# Patient Record
Sex: Male | Born: 1970 | Hispanic: No | Marital: Married | Smoking: Never smoker
Health system: Southern US, Community
[De-identification: ages and names within clinical notes are randomized; demographics above are authoritative.]

## PROBLEM LIST (undated history)

## (undated) DIAGNOSIS — E119 Type 2 diabetes mellitus without complications: Secondary | ICD-10-CM

## (undated) DIAGNOSIS — G4730 Sleep apnea, unspecified: Secondary | ICD-10-CM

## (undated) HISTORY — PX: WISDOM TOOTH EXTRACTION: SHX21

---

## 2015-11-07 ENCOUNTER — Emergency Department (HOSPITAL_COMMUNITY): Payer: Managed Care, Other (non HMO)

## 2015-11-07 ENCOUNTER — Encounter (HOSPITAL_COMMUNITY): Payer: Self-pay | Admitting: Emergency Medicine

## 2015-11-07 ENCOUNTER — Inpatient Hospital Stay (HOSPITAL_COMMUNITY)
Admission: EM | Admit: 2015-11-07 | Discharge: 2015-11-11 | DRG: 872 | Disposition: A | Discharge status: Home / Self Care | Payer: Managed Care, Other (non HMO) | Attending: Internal Medicine | Admitting: Internal Medicine

## 2015-11-07 ENCOUNTER — Inpatient Hospital Stay (HOSPITAL_COMMUNITY): Payer: Managed Care, Other (non HMO)

## 2015-11-07 DIAGNOSIS — E118 Type 2 diabetes mellitus with unspecified complications: Secondary | ICD-10-CM

## 2015-11-07 DIAGNOSIS — B9689 Other specified bacterial agents as the cause of diseases classified elsewhere: Secondary | ICD-10-CM

## 2015-11-07 DIAGNOSIS — L0390 Cellulitis, unspecified: Secondary | ICD-10-CM | POA: Insufficient documentation

## 2015-11-07 DIAGNOSIS — N289 Disorder of kidney and ureter, unspecified: Secondary | ICD-10-CM | POA: Diagnosis not present

## 2015-11-07 DIAGNOSIS — L03115 Cellulitis of right lower limb: Secondary | ICD-10-CM | POA: Insufficient documentation

## 2015-11-07 DIAGNOSIS — M7989 Other specified soft tissue disorders: Secondary | ICD-10-CM | POA: Diagnosis not present

## 2015-11-07 DIAGNOSIS — E1129 Type 2 diabetes mellitus with other diabetic kidney complication: Secondary | ICD-10-CM | POA: Diagnosis not present

## 2015-11-07 DIAGNOSIS — E119 Type 2 diabetes mellitus without complications: Secondary | ICD-10-CM | POA: Diagnosis present

## 2015-11-07 DIAGNOSIS — N179 Acute kidney failure, unspecified: Secondary | ICD-10-CM | POA: Diagnosis present

## 2015-11-07 DIAGNOSIS — Z7984 Long term (current) use of oral hypoglycemic drugs: Secondary | ICD-10-CM

## 2015-11-07 DIAGNOSIS — A419 Sepsis, unspecified organism: Principal | ICD-10-CM | POA: Diagnosis present

## 2015-11-07 DIAGNOSIS — M79609 Pain in unspecified limb: Secondary | ICD-10-CM | POA: Diagnosis not present

## 2015-11-07 DIAGNOSIS — D72829 Elevated white blood cell count, unspecified: Secondary | ICD-10-CM | POA: Insufficient documentation

## 2015-11-07 DIAGNOSIS — L0291 Cutaneous abscess, unspecified: Secondary | ICD-10-CM

## 2015-11-07 DIAGNOSIS — L02419 Cutaneous abscess of limb, unspecified: Secondary | ICD-10-CM | POA: Diagnosis present

## 2015-11-07 DIAGNOSIS — L03119 Cellulitis of unspecified part of limb: Secondary | ICD-10-CM | POA: Diagnosis present

## 2015-11-07 DIAGNOSIS — M79661 Pain in right lower leg: Secondary | ICD-10-CM | POA: Diagnosis present

## 2015-11-07 HISTORY — DX: Type 2 diabetes mellitus without complications: E11.9

## 2015-11-07 LAB — CBC WITH DIFFERENTIAL/PLATELET
Basophils Absolute: 0 10*3/uL (ref 0.0–0.1)
Basophils Relative: 0 %
EOS ABS: 0 10*3/uL (ref 0.0–0.7)
Eosinophils Relative: 0 %
HCT: 47.3 % (ref 39.0–52.0)
HEMOGLOBIN: 16.7 g/dL (ref 13.0–17.0)
LYMPHS ABS: 2.1 10*3/uL (ref 0.7–4.0)
Lymphocytes Relative: 15 %
MCH: 27.8 pg (ref 26.0–34.0)
MCHC: 35.3 g/dL (ref 30.0–36.0)
MCV: 78.8 fL (ref 78.0–100.0)
MONOS PCT: 12 %
Monocytes Absolute: 1.7 10*3/uL — ABNORMAL HIGH (ref 0.1–1.0)
NEUTROS PCT: 73 %
Neutro Abs: 9.8 10*3/uL — ABNORMAL HIGH (ref 1.7–7.7)
Platelets: 257 10*3/uL (ref 150–400)
RBC: 6 MIL/uL — ABNORMAL HIGH (ref 4.22–5.81)
RDW: 13.4 % (ref 11.5–15.5)
WBC: 13.6 10*3/uL — ABNORMAL HIGH (ref 4.0–10.5)

## 2015-11-07 LAB — COMPREHENSIVE METABOLIC PANEL
ALBUMIN: 3.9 g/dL (ref 3.5–5.0)
ALT: 32 U/L (ref 17–63)
AST: 31 U/L (ref 15–41)
Alkaline Phosphatase: 71 U/L (ref 38–126)
Anion gap: 15 (ref 5–15)
BUN: 21 mg/dL — AB (ref 6–20)
CHLORIDE: 98 mmol/L — AB (ref 101–111)
CO2: 22 mmol/L (ref 22–32)
CREATININE: 1.63 mg/dL — AB (ref 0.61–1.24)
Calcium: 9.3 mg/dL (ref 8.9–10.3)
GFR calc Af Amer: 59 mL/min — ABNORMAL LOW (ref >60)
GFR calc non Af Amer: 50 mL/min — ABNORMAL LOW (ref >60)
Glucose, Bld: 184 mg/dL — ABNORMAL HIGH (ref 65–99)
Potassium: 4 mmol/L (ref 3.5–5.1)
SODIUM: 135 mmol/L (ref 135–145)
Total Bilirubin: 1 mg/dL (ref 0.3–1.2)
Total Protein: 8.5 g/dL — ABNORMAL HIGH (ref 6.5–8.1)

## 2015-11-07 LAB — I-STAT CG4 LACTIC ACID, ED: LACTIC ACID, VENOUS: 1.94 mmol/L (ref 0.5–2.0)

## 2015-11-07 MED ORDER — SODIUM CHLORIDE 0.9 % IV BOLUS (SEPSIS)
1000.0000 mL | Freq: Once | INTRAVENOUS | Status: AC
  Administered 2015-11-07: 1000 mL via INTRAVENOUS

## 2015-11-07 MED ORDER — MORPHINE SULFATE (PF) 4 MG/ML IV SOLN
4.0000 mg | Freq: Once | INTRAVENOUS | Status: AC
  Administered 2015-11-07: 4 mg via INTRAVENOUS
  Filled 2015-11-07: qty 1

## 2015-11-07 MED ORDER — ACETAMINOPHEN 325 MG PO TABS
650.0000 mg | ORAL_TABLET | Freq: Four times a day (QID) | ORAL | Status: DC | PRN
  Administered 2015-11-07 – 2015-11-08 (×3): 650 mg via ORAL
  Filled 2015-11-07 (×3): qty 2

## 2015-11-07 MED ORDER — VANCOMYCIN HCL IN DEXTROSE 1-5 GM/200ML-% IV SOLN
1000.0000 mg | Freq: Once | INTRAVENOUS | Status: AC
  Administered 2015-11-07: 1000 mg via INTRAVENOUS
  Filled 2015-11-07: qty 200

## 2015-11-07 MED ORDER — HYDROCODONE-ACETAMINOPHEN 5-325 MG PO TABS
1.0000 | ORAL_TABLET | ORAL | Status: DC | PRN
  Administered 2015-11-08: 2 via ORAL
  Administered 2015-11-09: 1 via ORAL
  Filled 2015-11-07: qty 1
  Filled 2015-11-07: qty 2

## 2015-11-07 MED ORDER — ENOXAPARIN SODIUM 40 MG/0.4ML ~~LOC~~ SOLN
40.0000 mg | SUBCUTANEOUS | Status: DC
  Administered 2015-11-09: 40 mg via SUBCUTANEOUS
  Filled 2015-11-07 (×4): qty 0.4

## 2015-11-07 MED ORDER — POLYETHYLENE GLYCOL 3350 17 G PO PACK: 17.0000 g | PACK | Freq: Every day | ORAL | Status: DC | PRN

## 2015-11-07 MED ORDER — DOCUSATE SODIUM 100 MG PO CAPS
100.0000 mg | ORAL_CAPSULE | Freq: Two times a day (BID) | ORAL | Status: DC
  Administered 2015-11-10 – 2015-11-11 (×2): 100 mg via ORAL
  Filled 2015-11-07 (×9): qty 1

## 2015-11-07 MED ORDER — SODIUM CHLORIDE 0.9 % IV SOLN: INTRAVENOUS | Status: AC

## 2015-11-07 MED ORDER — VANCOMYCIN HCL 10 G IV SOLR: 1250.0000 mg | INTRAVENOUS | Status: DC

## 2015-11-07 MED ORDER — MORPHINE SULFATE (PF) 2 MG/ML IV SOLN
1.0000 mg | INTRAVENOUS | Status: DC | PRN
  Administered 2015-11-08 – 2015-11-10 (×4): 1 mg via INTRAVENOUS
  Filled 2015-11-07 (×4): qty 1

## 2015-11-07 MED ORDER — INSULIN ASPART 100 UNIT/ML ~~LOC~~ SOLN
0.0000 [IU] | Freq: Three times a day (TID) | SUBCUTANEOUS | Status: DC
  Administered 2015-11-09: 2 [IU] via SUBCUTANEOUS

## 2015-11-07 MED ORDER — INSULIN ASPART 100 UNIT/ML ~~LOC~~ SOLN: 0.0000 [IU] | Freq: Every day | SUBCUTANEOUS | Status: DC

## 2015-11-07 MED ORDER — ONDANSETRON HCL 4 MG PO TABS: 4.0000 mg | ORAL_TABLET | Freq: Three times a day (TID) | ORAL | Status: DC | PRN

## 2015-11-07 MED ORDER — SODIUM CHLORIDE 0.9 % IV SOLN
Freq: Once | INTRAVENOUS | Status: AC
  Administered 2015-11-07: 17:00:00 via INTRAVENOUS

## 2015-11-07 MED ORDER — PIPERACILLIN-TAZOBACTAM 3.375 G IVPB
3.3750 g | Freq: Once | INTRAVENOUS | Status: AC
  Administered 2015-11-07: 3.375 g via INTRAVENOUS
  Filled 2015-11-07: qty 50

## 2015-11-07 MED ORDER — VANCOMYCIN HCL IN DEXTROSE 1-5 GM/200ML-% IV SOLN
1000.0000 mg | INTRAVENOUS | Status: AC
  Administered 2015-11-07: 1000 mg via INTRAVENOUS
  Filled 2015-11-07: qty 200

## 2015-11-07 NOTE — Progress Notes (Signed)
Pharmacy Antibiotic Note  Ralph Bradley is a 45 y.o. male admitted on 11/07/2015 with cellulitis.  Pharmacy has been consulted for Vancomycin & Zosyn dosing.  Plan: Zosyn 3.375gm IV Q8h to be infused over 4hrs Vancomycin 2gm IV loading dose x1 then  IV q24h Check Vancomycin trough at steady state Monitor renal function and cx data      Temp (24hrs), Avg:98.5 F (36.9 C), Min:98.5 F (36.9 C), Max:98.5 F (36.9 C)   Recent Labs Lab 11/07/15 1655 11/07/15 1657  WBC  --  13.6*  CREATININE  --  1.63*  LATICACIDVEN 1.94  --     CrCl cannot be calculated (Unknown ideal weight.).    No Known Allergies  Antimicrobials this admission: 2/2 Vanc >> 2/2 Zosyn >>   Dose adjustments this admission:  Microbiology results: 2/2 BCx: IP  Thank you for allowing pharmacy to be a part of this patient's care.  Elson Clan 11/07/2015 7:19 PM

## 2015-11-07 NOTE — ED Provider Notes (Signed)
CSN: [id number]     Arrival date & time 11/07/15  1611 History   First MD Initiated Contact with Patient 11/07/15 1625     Chief Complaint  Patient presents with  . Cellulitis     (Consider location/radiation/quality/duration/timing/severity/associated sxs/prior Treatment) HPI Comments: Diabetic sent from PCPs office with cellulitis of the right leg. States it began approximately 3 days ago and has progressively worsened. Denies any injuries. Has had chills but not checked his temperature. No nausea or vomiting. No chest pain or shortness of breath. Progressive redness and swelling to R leg, circumferential. Some streaking to R medial thigh today.  Saw PCP and sent to the ED.  Has not been on antibiotics yet.  The history is provided by the patient and a relative.    Past Medical History  Diagnosis Date  . Diabetes mellitus without complication [facility])    Past Surgical History  Procedure Laterality Date  . Wisdom tooth extraction      20 years ago   Family History  Problem Relation Age of Onset  . Diabetes Mellitus II Mother    Social History  Substance Use Topics  . Smoking status: Never Smoker   . Smokeless tobacco: Never Used  . Alcohol Use: No    Review of Systems  Constitutional: Negative for fever, activity change and appetite change.  HENT: Negative for congestion and rhinorrhea.   Respiratory: Negative for cough, chest tightness and shortness of breath.   Cardiovascular: Positive for leg swelling. Negative for chest pain.  Gastrointestinal: Negative for nausea, vomiting and abdominal pain.  Genitourinary: Negative for dysuria and testicular pain.  Musculoskeletal: Negative for myalgias and arthralgias.  Skin: Positive for wound.  Neurological: Negative for dizziness and headaches.  A complete 10 system review of systems was obtained and all systems are negative except as noted in the HPI and PMH.      Allergies  Review of patient's allergies indicates no known  allergies.  Home Medications   Prior to Admission medications   Medication Sig Start Date End Date Taking? Authorizing Provider  metFORMIN (GLUCOPHAGE) 1000 MG tablet Take 1,000 mg by mouth 2 (two) times daily. 08/26/15  Yes Historical Provider, MD   BP 121/85 mmHg  Pulse 102  Temp(Src) 102.2 F (39 C) (Oral)  Resp 18  Ht  (1.727 m)  Wt 229 lb 8 oz (104.1 kg)  BMI 34.90 kg/m2  SpO2 98% Physical Exam  Constitutional: He is oriented to person, place, and time. He appears well-developed and well-nourished. No distress.  HENT:  Head: Normocephalic and atraumatic.  Mouth/Throat: Oropharynx is clear and moist. No oropharyngeal exudate.  Eyes: Conjunctivae and EOM are normal. Pupils are equal, round, and reactive to light.  Neck: Normal range of motion. Neck supple.  No meningismus.  Cardiovascular: Normal rate, normal heart sounds and intact distal pulses.   No murmur heard. tachycardic  Pulmonary/Chest: Effort normal and breath sounds normal. No respiratory distress.  Abdominal: Soft. There is no tenderness. There is no rebound and no guarding.  Musculoskeletal: Normal range of motion. He exhibits edema and tenderness.  Circumferential erythema, warmth, swelling to RLE.  Abrasion to dorsal foot. Intact DP pulse. Streaking erythema to R medial thigh Compartments soft  Neurological: He is alert and oriented to person, place, and time. No cranial nerve deficit. He exhibits normal muscle tone. Coordination normal.  No ataxia on finger to nose bilaterally. No pronator drift. 5/5 strength throughout. CN 2-12 intact.Equal grip strength. Sensation intact.   Skin:  Skin is warm.  Psychiatric: He has a normal mood and affect. His behavior is normal.  Nursing note and vitals reviewed.       ED Course  Procedures (including critical care time) Labs Review Labs Reviewed  COMPREHENSIVE METABOLIC PANEL - Abnormal; Notable for the following:    Chloride 98 (*)    Glucose, Bld 184  (*)    BUN 21 (*)    Creatinine, Ser 1.63 (*)    Total Protein 8.5 (*)    GFR calc non Af Amer 50 (*)    GFR calc Af Amer 59 (*)    All other components within normal limits  CBC WITH DIFFERENTIAL/PLATELET - Abnormal; Notable for the following:    WBC 13.6 (*)    RBC 6.00 (*)    Neutro Abs 9.8 (*)    Monocytes Absolute 1.7 (*)    All other components within normal limits  CULTURE, BLOOD (ROUTINE X 2)  CULTURE, BLOOD (ROUTINE X 2)  COMPREHENSIVE METABOLIC PANEL  HEMOGLOBIN A1C  HIV ANTIBODY (ROUTINE TESTING)  I-STAT CG4 LACTIC ACID, ED  I-STAT CG4 LACTIC ACID, ED    Imaging Review Dg Tibia/fibula Right  11/07/2015  CLINICAL DATA:  Right lower leg redness and swelling.  Pain EXAM: RIGHT TIBIA AND FIBULA - 2 VIEW COMPARISON:  None. FINDINGS: There is no evidence of fracture or other focal bone lesions. Mild diffuse soft tissue swelling. IMPRESSION: 1. No acute bone abnormality. 2. Soft tissue swelling. Electronically Signed   By: Signa Kell M.D.   On: 11/07/2015 18:17   I have personally reviewed and evaluated these images and lab results as part of my medical decision-making.   EKG Interpretation None      MDM   Final diagnoses:  Cellulitis of right lower extremity   Cellulitis and lymphangitis to RLE.  Tachycardic but afebrile. Nontoxic appearing.  Labs, cultures, IVF, IV antibiotics.   X-ray shows no evidence of subcutaneous gas. White blood cell count 13.6. Lactate is normal.  Patient be given IV Zosyn, IV vancomycin and IV fluids. Doppler is ordered to rule out DVT. Admission discussed with Dr. Criselda Peaches. Informed by the vascular tech that Doppler was negative. Her report states left leg the patient's symptoms are on the right leg. Patient has gone upstairs and is unable to be asked which leg was ultrasounded.   Glynn Octave, MD 11/07/15 [phone]

## 2015-11-07 NOTE — Progress Notes (Signed)
VASCULAR LAB PRELIMINARY  PRELIMINARY  PRELIMINARY  PRELIMINARY  Left lower extremity venous duplex completed.    Preliminary report:  Left:  No evidence of DVT, superficial thrombosis, or Baker's cyst.  Thierry Dobosz, RVS 11/07/2015, 7:31 PM

## 2015-11-07 NOTE — ED Notes (Signed)
Report given to charge nurse Onalee Hua but he said please bring patient up after shift change(7:20)

## 2015-11-07 NOTE — ED Notes (Signed)
MD at bedside. 

## 2015-11-07 NOTE — H&P (Addendum)
[facility] History and Physical  Ralph Bradley ZOX:[id number] DOB: 11/05/1970 DOA: 11/07/2015  Referring physician: ED physician PCP: No PCP Per Patient   Chief Complaint: pain and redness to leg  HPI:  Ralph Bradley is a 44yo man with PMH of DM treated with metformin who presents for right lower extremity swelling, redness.  He reports that he developed some redness and warmth to the leg about 3 days ago.  He has had a similar issue on the left leg which resolved on its own per report, so he didn't think much of it.  However, over the next few days the redness worsened as did the pain.  He also noted that he developed streaking of redness to his right thigh which led him to seek medical care today.  He reports associated chills and pain in the leg with a dependent position.  He denies any recent long travel, surgeries or sedentary states.  He has no history of clotting or family history of clotting disorder.  He does have a family history of DM.  He was also tachycardic in the ED, but he denies palpitations or chest pain.    Assessment and Plan: Cellulitis of the leg - Patient with erythema, warmth and pain with streaking, most likely strep or staph - Started on IV vanc and zosyn in the ED - BC X 2 - Likely can stop Zosyn tomorrow if responding to Abx given most likely source - Prelim doppler was negative for clot which was also a concern - IVF with NS at 75cc/hr - Pain control with morphine and vicoden - Nausea control with zofran - Screen for HIV - Tib/Fib xray without abscess or bone involvement.  - Tylenol for fever  DM2 - A1C unknown, lab ordered - SSI  - Hold metformin in setting of kidney function - Consider diabetes education  AKI vs. CKD - Cr. 1.63, unknown baseline - Hold metformin - IVF as noted above, recheck in the AM  DVT PPx: Lovenox  Diet Carb modified  Radiological Exams on Admission: Dg Tibia/fibula Right  11/07/2015  CLINICAL DATA:  Right lower leg  redness and swelling.  Pain EXAM: RIGHT TIBIA AND FIBULA - 2 VIEW COMPARISON:  None. FINDINGS: There is no evidence of fracture or other focal bone lesions. Mild diffuse soft tissue swelling. IMPRESSION: 1. No acute bone abnormality. 2. Soft tissue swelling. Electronically Signed   By: Signa Kell M.D.   On: 11/07/2015 18:17   Code Status: Full Family Communication: Pt at bedside Disposition Plan: Admit for further evaluation    Ralph Coder, MD [phone]   Review of Systems:  Constitutional: + for chills.   Negative for fever and malaise/fatigue. Negative for diaphoresis.  HENT: Negative for hearing loss, ear pain Eyes: Negative for blurred vision, double vision Respiratory: Negative for cough, hemoptysis, sputum production, shortness of breath,  Cardiovascular: Negative for chest pain, palpitations Gastrointestinal: Negative for nausea, vomiting and abdominal pain. Genitourinary: Negative for dysuria, urgency, frequency Musculoskeletal: Negative for myalgias, back pain, joint pain and falls.  Skin: + for skin changes as noted above.  Negative for itching and rash.  Neurological: Negative for dizziness and weakness.  Endo/Heme/Allergies: Negative for environmental allergies and polydipsia Psychiatric/Behavioral: The patient is not nervous/anxious.      Past Medical History  Diagnosis Date  . Diabetes mellitus without complication [facility])     Past Surgical History  Procedure Laterality Date  . Wisdom tooth extraction      20 years ago  Social History:  reports that he has never smoked. He has never used smokeless tobacco. He reports that he does not drink alcohol or use illicit drugs.  No Known Allergies  Family History  Problem Relation Age of Onset  . Diabetes Mellitus II Mother     Prior to Admission medications   Medication Sig Start Date End Date Taking? Authorizing Provider  metFORMIN (GLUCOPHAGE) 1000 MG tablet Take 1,000 mg by mouth 2 (two) times daily.  08/26/15  Yes Historical Provider, MD    Physical Exam: Filed Vitals:   11/07/15 1626 11/07/15 1645 11/07/15 1700 11/07/15 1821  BP: 120/80 109/67 107/69   Pulse: 121 111 103   Temp: 98.5 F (36.9 C)     TempSrc: Oral     Resp: 36 17 13   SpO2: 98% 98% 96% 98%    Physical Exam  Constitutional: Appears well-developed and well-nourished. No distress.  HENT: Normocephalic. Oropharynx is clear and moist.  Eyes: Conjunctivae are injected. No scleral icterus.  CVS: Mildly tachycardic regular rhythm, S1/S2 +, no murmurs Pulmonary: Effort and breath sounds normal, no wheezing Abdominal: Soft. BS +,  no distension Musculoskeletal: + tenderness and swelling to the right LE, LLE was without swelling and tenderness.  Pulses palpable at bilateral DP Lymphadenopathy: No lymphadenopathy noted, cervical, inguinal. Neuro: Alert. Normal muscle tone. No cranial nerve deficit. Skin: Skin is warm and dry. Right lower extremity with deep red changes from ankle to below knee, circumferential with streaking up thigh.  Increased warmth and tenderness to palpation.   Psychiatric: Normal mood and affect. Behavior, judgment, thought content normal.   Labs on Admission:  Basic Metabolic Panel:  Recent Labs Lab 11/07/15 1657  NA 135  K 4.0  CL 98*  CO2 22  GLUCOSE 184*  BUN 21*  CREATININE 1.63*  CALCIUM 9.3   Liver Function Tests:  Recent Labs Lab 11/07/15 1657  AST 31  ALT 32  ALKPHOS 71  BILITOT 1.0  PROT 8.5*  ALBUMIN 3.9   CBC:  Recent Labs Lab 11/07/15 1657  WBC 13.6*  NEUTROABS 9.8*  HGB 16.7  HCT 47.3  MCV 78.8  PLT 257    If 7PM-7AM, please contact night-coverage [email or web address] Password [email or web address] 11/07/2015, 8:37 PM

## 2015-11-07 NOTE — ED Notes (Signed)
US at bedside

## 2015-11-07 NOTE — ED Notes (Signed)
Pt sent by PCP for evaluation of complicated cellulitis of right leg. Pt with Hx of DM began felling chills and pain on Sunday night, right lower leg became edematous and erythematous on Monday.

## 2015-11-08 DIAGNOSIS — D72829 Elevated white blood cell count, unspecified: Secondary | ICD-10-CM

## 2015-11-08 DIAGNOSIS — E119 Type 2 diabetes mellitus without complications: Secondary | ICD-10-CM

## 2015-11-08 DIAGNOSIS — N179 Acute kidney failure, unspecified: Secondary | ICD-10-CM

## 2015-11-08 LAB — GLUCOSE, CAPILLARY
GLUCOSE-CAPILLARY: 110 mg/dL — AB (ref 65–99)
GLUCOSE-CAPILLARY: 117 mg/dL — AB (ref 65–99)
Glucose-Capillary: 119 mg/dL — ABNORMAL HIGH (ref 65–99)
Glucose-Capillary: 133 mg/dL — ABNORMAL HIGH (ref 65–99)
Glucose-Capillary: 170 mg/dL — ABNORMAL HIGH (ref 65–99)
Glucose-Capillary: 186 mg/dL — ABNORMAL HIGH (ref 65–99)
Glucose-Capillary: 91 mg/dL (ref 65–99)

## 2015-11-08 LAB — COMPREHENSIVE METABOLIC PANEL
ALBUMIN: 3.3 g/dL — AB (ref 3.5–5.0)
ALT: 23 U/L (ref 17–63)
AST: 23 U/L (ref 15–41)
Alkaline Phosphatase: 60 U/L (ref 38–126)
Anion gap: 9 (ref 5–15)
BUN: 18 mg/dL (ref 6–20)
CHLORIDE: 103 mmol/L (ref 101–111)
CO2: 24 mmol/L (ref 22–32)
CREATININE: 1.43 mg/dL — AB (ref 0.61–1.24)
Calcium: 8.6 mg/dL — ABNORMAL LOW (ref 8.9–10.3)
GFR calc Af Amer: >60 mL/min (ref >60)
GFR calc non Af Amer: 59 mL/min — ABNORMAL LOW (ref >60)
GLUCOSE: 123 mg/dL — AB (ref 65–99)
POTASSIUM: 4.2 mmol/L (ref 3.5–5.1)
SODIUM: 136 mmol/L (ref 135–145)
Total Bilirubin: 1.3 mg/dL — ABNORMAL HIGH (ref 0.3–1.2)
Total Protein: 7.1 g/dL (ref 6.5–8.1)

## 2015-11-08 LAB — HIV ANTIBODY (ROUTINE TESTING W REFLEX): HIV SCREEN 4TH GENERATION: NONREACTIVE

## 2015-11-08 MED ORDER — VANCOMYCIN HCL IN DEXTROSE 750-5 MG/150ML-% IV SOLN
750.0000 mg | Freq: Two times a day (BID) | INTRAVENOUS | Status: DC
  Administered 2015-11-08 – 2015-11-11 (×6): 750 mg via INTRAVENOUS
  Filled 2015-11-08 (×7): qty 150

## 2015-11-08 MED ORDER — LIVING WELL WITH DIABETES BOOK
Freq: Once | Status: AC
  Administered 2015-11-08: 12:00:00
  Filled 2015-11-08: qty 1

## 2015-11-08 MED ORDER — PIPERACILLIN-TAZOBACTAM 3.375 G IVPB
3.3750 g | Freq: Three times a day (TID) | INTRAVENOUS | Status: DC
  Administered 2015-11-08 – 2015-11-11 (×10): 3.375 g via INTRAVENOUS
  Filled 2015-11-08 (×11): qty 50

## 2015-11-08 MED ORDER — VANCOMYCIN HCL IN DEXTROSE 1-5 GM/200ML-% IV SOLN: 1000.0000 mg | Freq: Once | INTRAVENOUS | Status: DC

## 2015-11-08 MED ORDER — PIPERACILLIN-TAZOBACTAM 3.375 G IVPB 30 MIN: 3.3750 g | Freq: Once | INTRAVENOUS | Status: DC

## 2015-11-08 NOTE — Progress Notes (Signed)
Inpatient Diabetes Program Recommendations  AACE/ADA: New Consensus Statement on Inpatient Glycemic Control (2015)  Target Ranges:  Prepandial:   less than 140 mg/dL      Peak postprandial:   less than 180 mg/dL (1-2 hours)      Critically ill patients:  140 - 180 mg/dL   Review of Glycemic Control  Diabetes history: DM2 Outpatient Diabetes medications: metformin 1000 mg bid Current orders for Inpatient glycemic control: Novolog moderate tidwc and hs  Results for RAYLAN, HANTON (MRN [id number]) as of 11/08/2015 11:38  Ref. Range 11/07/2015 22:08 11/07/2015 23:45 11/08/2015 04:01 11/08/2015 07:41  Glucose-Capillary Latest Ref Range: 65-99 mg/dL 186 (H) 170 (H) 110 (H) 117 (H)  Results for BYRNE, CAPEK (MRN [id number]) as of 11/08/2015 11:38  Ref. Range 11/08/2015 06:10  Sodium Latest Ref Range: 135-145 mmol/L 136  Potassium Latest Ref Range: 3.5-5.1 mmol/L 4.2  Chloride Latest Ref Range: 101-111 mmol/L 103  CO2 Latest Ref Range: 22-32 mmol/L 24  BUN Latest Ref Range: 6-20 mg/dL 18  Creatinine Latest Ref Range: 0.61-1.24 mg/dL 1.43 (H)  Calcium Latest Ref Range: 8.9-10.3 mg/dL 8.6 (L)  EGFR (Non-African Amer.) Latest Ref Range: >60 mL/min 59 (L)  EGFR (African American) Latest Ref Range: >60 mL/min >60  Glucose Latest Ref Range: 65-99 mg/dL 123 (H)  HgbA1C pending.  Inpatient Diabetes Program Recommendations:    Will await HgbA1C results to complete assessment. Ordered Living Well With Diabetes book and diabetes videos on pt ed channel. Will order OP Diabetes Education consult.  To speak with pt this afternoon. Thank you. Lorenda Peck, RD, LDN, CDE Inpatient Diabetes Coordinator [phone]

## 2015-11-08 NOTE — Progress Notes (Addendum)
Patient ID: Ralph Bradley, male   DOB: [birth year], 45 y.o.   MRN: [id number] [facility] PROGRESS NOTE  Ralph Bradley UXL:[id number] DOB: [birth year] DOA: 11/07/2015 PCP: No PCP Per Patient - will set up with community health wellness clinic on discharge  Brief narrative:    45 year old male with past medical history of diabetes, not compliant with metformin who presented with worsening right lower extremity erythema, swelling and tenderness over past couple days prior to this admission. He reports no open wounds. No trauma to the leg. No reports of insect bites.  No acute findings on tibia/fibula x ray. Venous Doppler was negative for DVT. He was started on vancomycin and zosyn.  Assessment/Plan:    Principal problem: Sepsis secondary to right lower extremity cellulitis / leukocytosis  - Sepsis criteria met on admission with fever of 102.2 F, tachycardia, tachypnea, leukocytosis and source of infection right lower extremity cellulitis - No acute findings on plain films - No reports of DVT on vascular Doppler - Continue vancomycin and zosyn  Active problems:  Type 2 diabetes mellitus without complications without long-term insulin use  - No previous A1c values on file  - A1c on this admission is pending  - Currently on sliding scale insulin  - Appreciate diabetic coordinator recommendations   Acute kidney injury - Creatinine is 1.6 on the admission but no previous values for comparison - Likely in the setting of acute infection - Creatinine improving with fluids    DVT Prophylaxis  - Lovenox subcutaneous ordered   Code Status: Full.  Family Communication:  plan of care discussed with the patient and his wife at the bedside Disposition Plan: Home likely by 11/11/2015 is cellulitis improved  IV access:  Peripheral IV  Procedures and diagnostic studies:    Dg Tibia/fibula Right 11/07/2015  1. No acute bone abnormality. 2. Soft tissue swelling. Electronically Signed   By:  Kerby Moors M.D.   On: 11/07/2015 18:17   Lower extremity Doppler 11/07/2015 - no lower extremity DVT   Medical Consultants:  None  Other Consultants:  Diabetic coordinator  IAnti-Infectives:   Vancomycin 11/06/2005 --> Zosyn 11/07/2015 -->    [name], MD  [facility] Pager [phone]  Time spent in minutes: 25 minutes  If 7PM-7AM, please contact night-coverage [email or web address] Password [email or web address] 11/08/2015, 11:03 AM   LOS: 1 day    HPI/Subjective: No acute overnight events. Patient reports he feels better.  Objective: Filed Vitals:   11/07/15 1821 11/07/15 2015 11/08/15 0515 11/08/15 0800  BP:  121/85 118/71 111/71  Pulse:  102 94 93  Temp:  102.2 F (39 C) 100.7 F (38.2 C) 101.4 F (38.6 C)  TempSrc:  Oral Oral Oral  Resp:  18 18   Height:  '5\' 8"'  (1.727 m)    Weight:  229 lb 8 oz (104.1 kg)    SpO2: 98% 98% 98% 95%   No intake or output data in the 24 hours ending 11/08/15 1103  Exam:   General:  Pt is alert, follows commands appropriately, not in acute distress  Cardiovascular: Regular rate and rhythm, S1/S2 (+)  Respiratory: Clear to auscultation bilaterally, no wheezing, no crackles, no rhonchi  Abdomen: Soft, non tender, non distended, bowel sounds present  Extremities: Right lower extremity erythema, slight tenderness to palpation, pulses DP and PT palpable bilaterally  Neuro: Grossly nonfocal  Data Reviewed: Basic Metabolic Panel:  Recent Labs Lab 11/07/15 1657 11/08/15 0610  NA 135 136  K 4.0 4.2  CL 98* 103  CO2 22 24  GLUCOSE 184* 123*  BUN 21* 18  CREATININE 1.63* 1.43*  CALCIUM 9.3 8.6*   Liver Function Tests:  Recent Labs Lab 11/07/15 1657 11/08/15 0610  AST 31 23  ALT 32 23  ALKPHOS 71 60  BILITOT 1.0 1.3*  PROT 8.5* 7.1  ALBUMIN 3.9 3.3*   No results for input(s): LIPASE, AMYLASE in the last 168 hours. No results for input(s): AMMONIA in the last 168 hours. CBC:  Recent Labs Lab 11/07/15 1657  WBC  13.6*  NEUTROABS 9.8*  HGB 16.7  HCT 47.3  MCV 78.8  PLT 257   Cardiac Enzymes: No results for input(s): CKTOTAL, CKMB, CKMBINDEX, TROPONINI in the last 168 hours. BNP: Invalid input(s): POCBNP CBG:  Recent Labs Lab 11/07/15 2208 11/07/15 2345 11/08/15 0401 11/08/15 0741  GLUCAP 186* 170* 110* 117*    Recent Results (from the past 240 hour(s))  Culture, blood (routine x 2)     Status: None (Preliminary result)   Collection Time: 11/07/15  4:45 PM  Result Value Ref Range Status   Specimen Description   Final    BLOOD RIGHT FOREARM Performed at [facility]    Special Requests BOTTLES DRAWN AEROBIC AND ANAEROBIC [facility] EACH  Final   Culture PENDING  Incomplete   Report Status PENDING  Incomplete     Scheduled Meds: . sodium chloride   Intravenous STAT  . docusate sodium  100 mg Oral BID  . enoxaparin (LOVENOX) injection  40 mg Subcutaneous Q24H  . insulin aspart  0-15 Units Subcutaneous TID WC  . insulin aspart  0-5 Units Subcutaneous QHS  . vancomycin  1,250 mg Intravenous Q24H   Continuous Infusions:

## 2015-11-08 NOTE — Progress Notes (Signed)
Pharmacy Antibiotic Note  Ralph Bradley is a 45 y.o. male admitted on 11/07/2015 with cellulitis.  Pharmacy has been consulted for Vancomycin & Zosyn dosing.  Plan: Improved renal function today.  Continue Zosyn 3.375gm IV Q8h to be infused over 4hrs Adjust to maintenance dose Vancomycin  IV q12h Check Vancomycin trough at steady state Monitor renal function and cx data   Height:  (172.7 cm) Weight: 229 lb 8 oz (104.1 kg) IBW/kg (Calculated) : 68.4  Temp (24hrs), Avg:100.3 F (37.9 C), Min:98.5 F (36.9 C), Max:102.2 F (39 C)   Recent Labs Lab 11/07/15 1655 11/07/15 1657 11/08/15 0610  WBC  --  13.6*  --   CREATININE  --  1.63* 1.43*  LATICACIDVEN 1.94  --   --     Estimated Creatinine Clearance: 78.7 mL/min (by C-G formula based on Cr of 1.43).    No Known Allergies  Antimicrobials this admission: 2/2 Vanc >> 2/2 Zosyn >>   Dose adjustments this admission: 2/3 Vanc  q24h-->  q12h for improved renal fxn  Microbiology results: 2/2 BCx: IP  Thank you for allowing pharmacy to be a part of this patient's care.  Haynes Hoehn, PharmD, BCPS 11/08/2015, 1:01 PM  Pager: [phone]

## 2015-11-08 NOTE — Progress Notes (Signed)
Patient watched videos on Diabetes and given Living Well with Diabetes book.

## 2015-11-09 LAB — GLUCOSE, CAPILLARY
GLUCOSE-CAPILLARY: 125 mg/dL — AB (ref 65–99)
Glucose-Capillary: 120 mg/dL — ABNORMAL HIGH (ref 65–99)
Glucose-Capillary: 94 mg/dL (ref 65–99)
Glucose-Capillary: 119 mg/dL — ABNORMAL HIGH (ref 65–99)

## 2015-11-09 LAB — HEMOGLOBIN A1C
HEMOGLOBIN A1C: 7.2 % — AB (ref 4.8–5.6)
MEAN PLASMA GLUCOSE: 160 mg/dL

## 2015-11-09 NOTE — Progress Notes (Signed)
Patient ID: Ralph Bradley, male   DOB: 1971/03/04, 45 y.o.   MRN: [id number] [facility] PROGRESS NOTE  Ralph Bradley NWG:[id number] DOB: Oct 16, 1970 DOA: 11/07/2015 PCP: No PCP Per Patient - will set up with community health wellness clinic on discharge  Brief narrative:    45 year old male with past medical history of diabetes, not compliant with metformin who presented with worsening right lower extremity erythema, swelling and tenderness over past couple days prior to this admission. He reports no open wounds. No trauma to the leg. No reports of insect bites.  No acute findings on tibia/fibula x ray. Venous Doppler was negative for DVT. He was started on vancomycin and zosyn.  Assessment/Plan:    Principal problem: Sepsis secondary to right lower extremity cellulitis / leukocytosis  - Sepsis criteria met on admission with fever of 102.2 F, tachycardia, tachypnea, leukocytosis and source of infection right lower extremity cellulitis - Continue vanco and zosyn - No DVT on LE doppler   Active problems:  Type 2 diabetes mellitus without complications without long-term insulin use  - A1c on this admission is pending  - Continue sliding scale insulin  - Appreciate diabetic coordinator recommendations   Acute kidney injury - Creatinine is 1.6 on the admission but no previous values for comparison - Likely in the setting of acute infection - Creatinine improving with fluids  DVT Prophylaxis  - Lovenox subcutaneous ordered  Code Status: Full.  Family Communication:  plan of care discussed with the patient and his wife at the bedside Disposition Plan: Home likely by 11/11/2015 is cellulitis improved  IV access:  Peripheral IV  Procedures and diagnostic studies:    Dg Tibia/fibula Right 11/07/2015  1. No acute bone abnormality. 2. Soft tissue swelling. Electronically Signed   By: Kerby Moors M.D.   On: 11/07/2015 18:17   Lower extremity Doppler 11/07/2015 - no lower extremity  DVT  Medical Consultants:  None  Other Consultants:  Diabetic coordinator  IAnti-Infectives:   Vancomycin 11/06/2005 --> Zosyn 11/07/2015 -->    [name], MD  [facility] Pager [phone]  Time spent in minutes: 25 minutes  If 7PM-7AM, please contact night-coverage [email or web address] Password [email or web address] 11/09/2015, 11:29 AM   LOS: 2 days    HPI/Subjective: No acute overnight events. Patient reports pain is controlled.   Objective: Filed Vitals:   11/08/15 1424 11/08/15 1621 11/08/15 2130 11/08/15 2254  BP: 116/78  120/73   Pulse: 90  100   Temp: 99.7 F (37.6 C) 98.9 F (37.2 C) 100.3 F (37.9 C) 98.5 F (36.9 C)  TempSrc: Oral Oral Oral Oral  Resp:   20   Height:      Weight:      SpO2: 97%  97%    No intake or output data in the 24 hours ending 11/09/15 1129  Exam:   General:  Pt is not in distress   Cardiovascular: RRR, (+) S1, S2  Respiratory: no wheezing, no rhonchi   Abdomen: (+) BS, non tender   Extremities: Right lower extremity erythema somewhat better, no open wounds, palpable pulses   Neuro: Nonfocal  Data Reviewed: Basic Metabolic Panel:  Recent Labs Lab 11/07/15 1657 11/08/15 0610  NA 135 136  K 4.0 4.2  CL 98* 103  CO2 22 24  GLUCOSE 184* 123*  BUN 21* 18  CREATININE 1.63* 1.43*  CALCIUM 9.3 8.6*   Liver Function Tests:  Recent Labs Lab 11/07/15 1657 11/08/15 0610  AST 31 23  ALT 32 23  ALKPHOS  71 60  BILITOT 1.0 1.3*  PROT 8.5* 7.1  ALBUMIN 3.9 3.3*   No results for input(s): LIPASE, AMYLASE in the last 168 hours. No results for input(s): AMMONIA in the last 168 hours. CBC:  Recent Labs Lab 11/07/15 1657  WBC 13.6*  NEUTROABS 9.8*  HGB 16.7  HCT 47.3  MCV 78.8  PLT 257   Cardiac Enzymes: No results for input(s): CKTOTAL, CKMB, CKMBINDEX, TROPONINI in the last 168 hours. BNP: Invalid input(s): POCBNP CBG:  Recent Labs Lab 11/08/15 0741 11/08/15 1209 11/08/15 1654 11/08/15 2236 11/09/15 0740   GLUCAP 117* 91 119* 133* 120*    Recent Results (from the past 240 hour(s))  Culture, blood (routine x 2)     Status: None (Preliminary result)   Collection Time: 11/07/15  4:40 PM  Result Value Ref Range Status   Specimen Description BLOOD LEFT ANTECUBITAL  Final   Special Requests BOTTLES DRAWN AEROBIC AND ANAEROBIC 5CC EACH  Final   Culture   Final    NO GROWTH < 24 HOURS Performed at [facility]    Report Status PENDING  Incomplete  Culture, blood (routine x 2)     Status: None (Preliminary result)   Collection Time: 11/07/15  4:45 PM  Result Value Ref Range Status   Specimen Description BLOOD RIGHT FOREARM  Final   Special Requests BOTTLES DRAWN AEROBIC AND ANAEROBIC 5CC EACH  Final   Culture   Final    NO GROWTH < 24 HOURS Performed at [facility]    Report Status PENDING  Incomplete     Scheduled Meds: . docusate sodium  100 mg Oral BID  . enoxaparin (LOVENOX) injection  40 mg Subcutaneous Q24H  . insulin aspart  0-15 Units Subcutaneous TID WC  . insulin aspart  0-5 Units Subcutaneous QHS  . piperacillin-tazobactam (ZOSYN)  IV  3.375 g Intravenous Q8H  . vancomycin  750 mg Intravenous Q12H   Continuous Infusions:

## 2015-11-10 DIAGNOSIS — A419 Sepsis, unspecified organism: Principal | ICD-10-CM

## 2015-11-10 DIAGNOSIS — L0390 Cellulitis, unspecified: Secondary | ICD-10-CM

## 2015-11-10 LAB — CBC
HEMATOCRIT: 41.3 % (ref 39.0–52.0)
Hemoglobin: 13.4 g/dL (ref 13.0–17.0)
MCH: 26.3 pg (ref 26.0–34.0)
MCHC: 32.4 g/dL (ref 30.0–36.0)
MCV: 81.1 fL (ref 78.0–100.0)
Platelets: 327 10*3/uL (ref 150–400)
RBC: 5.09 MIL/uL (ref 4.22–5.81)
RDW: 13.8 % (ref 11.5–15.5)
WBC: 11.6 10*3/uL — ABNORMAL HIGH (ref 4.0–10.5)

## 2015-11-10 LAB — GLUCOSE, CAPILLARY
GLUCOSE-CAPILLARY: 97 mg/dL (ref 65–99)
GLUCOSE-CAPILLARY: 101 mg/dL — AB (ref 65–99)
Glucose-Capillary: 107 mg/dL — ABNORMAL HIGH (ref 65–99)

## 2015-11-10 LAB — BASIC METABOLIC PANEL
Anion gap: 8 (ref 5–15)
BUN: 17 mg/dL (ref 6–20)
CHLORIDE: 105 mmol/L (ref 101–111)
CO2: 26 mmol/L (ref 22–32)
Calcium: 9 mg/dL (ref 8.9–10.3)
Creatinine, Ser: 1.38 mg/dL — ABNORMAL HIGH (ref 0.61–1.24)
GFR calc Af Amer: >60 mL/min (ref >60)
GFR calc non Af Amer: >60 mL/min (ref >60)
GLUCOSE: 110 mg/dL — AB (ref 65–99)
POTASSIUM: 4.1 mmol/L (ref 3.5–5.1)
Sodium: 139 mmol/L (ref 135–145)

## 2015-11-10 MED ORDER — ENOXAPARIN SODIUM 60 MG/0.6ML ~~LOC~~ SOLN
50.0000 mg | SUBCUTANEOUS | Status: DC
  Administered 2015-11-10: 50 mg via SUBCUTANEOUS
  Filled 2015-11-10 (×2): qty 0.6

## 2015-11-10 MED ORDER — VITAMINS A & D EX OINT
TOPICAL_OINTMENT | CUTANEOUS | Status: AC
  Administered 2015-11-10: 1
  Filled 2015-11-10: qty 5

## 2015-11-10 NOTE — Progress Notes (Signed)
Patient ID: Ralph Bradley, male   DOB: 18-Jan-1971, 45 y.o.   MRN: [id number] [facility] PROGRESS NOTE  Ralph Bradley GEX:[id number] DOB: Aug 06, 1971 DOA: 11/07/2015 PCP: No PCP Per Patient - will set up with community health wellness clinic on discharge  Brief narrative:    45 year old male with past medical history of diabetes, not compliant with metformin who presented with worsening right lower extremity erythema, swelling and tenderness over past couple days prior to this admission. He reports no open wounds. No trauma to the leg. No reports of insect bites.  No acute findings on tibia/fibula x ray. Venous Doppler was negative for DVT. He was started on vancomycin and zosyn.  Assessment/Plan:    Principal problem: Sepsis secondary to right lower extremity cellulitis / leukocytosis  - Sepsis criteria met on admission with fever of 102.2 F, tachycardia, tachypnea, leukocytosis and source of infection right lower extremity cellulitis - Continue vanco and zosyn for now - Cellulitis better this am - No DVT on LE doppler   Active problems:  Type 2 diabetes mellitus without complications without long-term insulin use  - A1c on this admission is 7.2 - Can be discharged with metformin BID regimen  Acute kidney injury - Creatinine is 1.6 on the admission but no previous values for comparison - Likely in the setting of acute infection - Creatinine improving with fluids  DVT Prophylaxis  - Lovenox subQ  Code Status: Full.  Family Communication:  plan of care discussed with the patient and his wife at the bedside Disposition Plan: Home likely by 11/11/2015   IV access:  Peripheral IV  Procedures and diagnostic studies:    Dg Tibia/fibula Right 11/07/2015  1. No acute bone abnormality. 2. Soft tissue swelling. Electronically Signed   By: Kerby Moors M.D.   On: 11/07/2015 18:17   Lower extremity Doppler 11/07/2015 - no lower extremity DVT  Medical Consultants:  None  Other  Consultants:  Diabetic coordinator  IAnti-Infectives:   Vancomycin 11/06/2005 --> Zosyn 11/07/2015 -->    [name], MD  [facility] Pager [phone]  Time spent in minutes: 15 minutes  If 7PM-7AM, please contact night-coverage [email or web address] Password [email or web address] 11/10/2015, 12:41 PM   LOS: 3 days    HPI/Subjective: No acute overnight events. Patient reports feeling okay  Objective: Filed Vitals:   11/09/15 1409 11/09/15 1500 11/09/15 2117 11/10/15 0600  BP: 94/75 104/64 109/55 102/65  Pulse: 83  81 64  Temp: 98.9 F (37.2 C)  99.8 F (37.7 C) 98.1 F (36.7 C)  TempSrc: Oral  Oral Oral  Resp: '18  18 16  ' Height:      Weight:      SpO2: 99%  96% 98%    Intake/Output Summary (Last 24 hours) at 11/10/15 1241 Last data filed at 11/10/15 0800  Gross per 24 hour  Intake    720 ml  Output    300 ml  Net    420 ml    Exam:   General:  Pt is alert, awake   Cardiovascular: Rate controlled, appreciate S1, S2  Respiratory: bilateral air entry, no rhonchi   Abdomen: (+) BS, non distended   Extremities: Right lower extremity erythema improving, tender to palpation  Neuro: No focal deficits   Data Reviewed: Basic Metabolic Panel:  Recent Labs Lab 11/07/15 1657 11/08/15 0610 11/10/15 0609  NA 135 136 139  K 4.0 4.2 4.1  CL 98* 103 105  CO2 '22 24 26  ' GLUCOSE 184* 123* 110*  BUN 21*  18 17  CREATININE 1.63* 1.43* 1.38*  CALCIUM 9.3 8.6* 9.0   Liver Function Tests:  Recent Labs Lab 11/07/15 1657 11/08/15 0610  AST 31 23  ALT 32 23  ALKPHOS 71 60  BILITOT 1.0 1.3*  PROT 8.5* 7.1  ALBUMIN 3.9 3.3*   No results for input(s): LIPASE, AMYLASE in the last 168 hours. No results for input(s): AMMONIA in the last 168 hours. CBC:  Recent Labs Lab 11/07/15 1657 11/10/15 0609  WBC 13.6* 11.6*  NEUTROABS 9.8*  --   HGB 16.7 13.4  HCT 47.3 41.3  MCV 78.8 81.1  PLT 257 327   Cardiac Enzymes: No results for input(s): CKTOTAL, CKMB, CKMBINDEX,  TROPONINI in the last 168 hours. BNP: Invalid input(s): POCBNP CBG:  Recent Labs Lab 11/09/15 0740 11/09/15 1159 11/09/15 1628 11/09/15 2205 11/10/15 0726  GLUCAP 120* 125* 94 119* 97    Recent Results (from the past 240 hour(s))  Culture, blood (routine x 2)     Status: None (Preliminary result)   Collection Time: 11/07/15  4:40 PM  Result Value Ref Range Status   Specimen Description BLOOD LEFT ANTECUBITAL  Final   Special Requests BOTTLES DRAWN AEROBIC AND ANAEROBIC 5CC EACH  Final   Culture   Final    NO GROWTH 2 DAYS Performed at [facility]    Report Status PENDING  Incomplete  Culture, blood (routine x 2)     Status: None (Preliminary result)   Collection Time: 11/07/15  4:45 PM  Result Value Ref Range Status   Specimen Description BLOOD RIGHT FOREARM  Final   Special Requests BOTTLES DRAWN AEROBIC AND ANAEROBIC 5CC EACH  Final   Culture   Final    NO GROWTH 2 DAYS Performed at [facility]    Report Status PENDING  Incomplete     Scheduled Meds: . docusate sodium  100 mg Oral BID  . enoxaparin (LOVENOX) injection  40 mg Subcutaneous Q24H  . insulin aspart  0-15 Units Subcutaneous TID WC  . insulin aspart  0-5 Units Subcutaneous QHS  . piperacillin-tazobactam (ZOSYN)  IV  3.375 g Intravenous Q8H  . vancomycin  750 mg Intravenous Q12H   Continuous Infusions:

## 2015-11-10 NOTE — Progress Notes (Signed)
Utilization Review Completed.Kryssa Risenhoover T2/02/2016  

## 2015-11-11 ENCOUNTER — Ambulatory Visit (HOSPITAL_COMMUNITY): Payer: Managed Care, Other (non HMO)

## 2015-11-11 ENCOUNTER — Encounter (HOSPITAL_COMMUNITY): Payer: Self-pay | Admitting: Radiology

## 2015-11-11 DIAGNOSIS — E119 Type 2 diabetes mellitus without complications: Secondary | ICD-10-CM | POA: Insufficient documentation

## 2015-11-11 DIAGNOSIS — L03115 Cellulitis of right lower limb: Secondary | ICD-10-CM | POA: Insufficient documentation

## 2015-11-11 DIAGNOSIS — D72829 Elevated white blood cell count, unspecified: Secondary | ICD-10-CM | POA: Insufficient documentation

## 2015-11-11 LAB — GLUCOSE, CAPILLARY
GLUCOSE-CAPILLARY: 91 mg/dL (ref 65–99)
GLUCOSE-CAPILLARY: 91 mg/dL (ref 65–99)
Glucose-Capillary: 96 mg/dL (ref 65–99)

## 2015-11-11 MED ORDER — IOHEXOL 300 MG/ML  SOLN
100.0000 mL | Freq: Once | INTRAMUSCULAR | Status: AC | PRN
  Administered 2015-11-11: 80 mL via INTRAVENOUS

## 2015-11-11 MED ORDER — CLINDAMYCIN HCL 300 MG PO CAPS: 300.0000 mg | ORAL_CAPSULE | Freq: Three times a day (TID) | ORAL | Status: DC

## 2015-11-11 NOTE — Discharge Summary (Signed)
Physician Discharge Summary  Ralph Bradley TIR:[id number] DOB: 14-May-1971 DOA: 11/07/2015  PCP: F/U in [facility] in 2 weeks from discharge   Admit date: 11/07/2015 Discharge date: 11/11/2015  Recommendations for Outpatient Follow-up:  1. Clinda for 10 days on discharge   Discharge Diagnoses:  Active Problems:   Cellulitis and abscess of leg   Diabetes mellitus, type 2 ([place])   Acute kidney failure ([place])    Discharge Condition: stable   Diet recommendation: as tolerated   History of present illness:  45 year old male with past medical history of diabetes, not compliant with metformin who presented with worsening right lower extremity erythema, swelling and tenderness over past couple days prior to this admission. He reports no open wounds. No trauma to the leg. No reports of insect bites.  No acute findings on tibia/fibula x ray. Venous Doppler was negative for DVT. He was started on vancomycin and zosyn.  Hospital Course:   Assessment/Plan:    Principal problem: Sepsis secondary to right lower extremity cellulitis / leukocytosis  - Sepsis criteria met on admission with fever of 102.2 F, tachycardia, tachypnea, leukocytosis and source of infection right lower extremity cellulitis - Was on vanco and zosyn. Will switch to clinda for 10 days on discharge  - Cellulitis better  - No DVT on LE doppler   Active problems:  Type 2 diabetes mellitus without complications without long-term insulin use  - A1c on this admission is 7.2 - Can be discharged with metformin BID regimen  Acute kidney injury - Creatinine is 1.6 on the admission but no previous values for comparison - Likely in the setting of acute infection - Creatinine improved with fluids   DVT Prophylaxis  - Lovenox subQ in hospital   Code Status: Full.  Family Communication: plan of care discussed with the patient and his wife at the bedside   IV access:  Peripheral IV  Procedures and diagnostic studies:    Dg Tibia/fibula Right 11/07/2015 1. No acute bone abnormality. 2. Soft tissue swelling. Electronically Signed By: Kerby Moors M.D. On: 11/07/2015 18:17   Lower extremity Doppler 11/07/2015 - no lower extremity DVT  Medical Consultants:  None  Other Consultants:  Diabetic coordinator  IAnti-Infectives:   Vancomycin 11/06/2005 --> Zosyn 11/07/2015 -->    Signed:  [name], MD  [facility] 11/11/2015, 12:44 PM  Pager #: [phone]  Time spent in minutes: more than 30 minutes   Discharge Exam: Filed Vitals:   11/10/15 2143 11/11/15 0629  BP: 113/57 109/57  Pulse: 78 83  Temp: 98.9 F (37.2 C) 98.6 F (37 C)  Resp: 17 17   Filed Vitals:   11/10/15 0600 11/10/15 1500 11/10/15 2143 11/11/15 0629  BP: 102/65 100/62 113/57 109/57  Pulse: 64 73 78 83  Temp: 98.1 F (36.7 C) 99.1 F (37.3 C) 98.9 F (37.2 C) 98.6 F (37 C)  TempSrc: Oral Oral Oral Oral  Resp: '16  17 17  ' Height:      Weight:      SpO2: 98% 97% 96% 96%    General: Pt is alert, follows commands appropriately, not in acute distress Cardiovascular: Regular rate and rhythm, S1/S2 +, no murmurs Respiratory: Clear to auscultation bilaterally, no wheezing, no crackles, no rhonchi Abdominal: Soft, non tender, non distended, bowel sounds +, no guarding Extremities: pulses palpable bilaterally DP and PT Neuro: Grossly nonfocal  Discharge Instructions  Discharge Instructions    Ambulatory referral to Nutrition and Diabetic Education    Complete by:  As directed  Call MD for:  difficulty breathing, headache or visual disturbances    Complete by:  As directed      Call MD for:  persistant dizziness or light-headedness    Complete by:  As directed      Call MD for:  persistant nausea and vomiting    Complete by:  As directed      Call MD for:  severe uncontrolled pain    Complete by:  As directed      Diet - low sodium heart healthy    Complete by:  As directed       Discharge instructions    Complete by:  As directed   Continue clinda for 10 days     Increase activity slowly    Complete by:  As directed             Medication List    TAKE these medications        clindamycin 300 MG capsule  Commonly known as:  CLEOCIN  Take 1 capsule (300 mg total) by mouth 3 (three) times daily.     metFORMIN 1000 MG tablet  Commonly known as:  GLUCOPHAGE  Take 1,000 mg by mouth 2 (two) times daily.          The results of significant diagnostics from this hospitalization (including imaging, microbiology, ancillary and laboratory) are listed below for reference.    Significant Diagnostic Studies: Dg Tibia/fibula Right  11/07/2015  CLINICAL DATA:  Right lower leg redness and swelling.  Pain EXAM: RIGHT TIBIA AND FIBULA - 2 VIEW COMPARISON:  None. FINDINGS: There is no evidence of fracture or other focal bone lesions. Mild diffuse soft tissue swelling. IMPRESSION: 1. No acute bone abnormality. 2. Soft tissue swelling. Electronically Signed   By: Kerby Moors M.D.   On: 11/07/2015 18:17   Ct Tibia Fibula Right W Contrast  11/11/2015  CLINICAL DATA:  Right lower leg pain, tenderness, redness and swelling for a few days. Question abscess. Initial encounter. No known injury. EXAM: CT OF THE RIGHT TIBIA AND FIBULA WITH CONTRAST TECHNIQUE: Contiguous axial CT images were taken through the right lower leg after the administration of intravenous contrast material. Coronal and sagittal reformatted images are provided appear CONTRAST:  80 mL OMNIPAQUE IOHEXOL 300 MG/ML  SOLN COMPARISON:  Plain films 11/06/2014. FINDINGS: Subcutaneous edema is present diffusely about the right lower leg. No focal fluid collection is seen. Imaged muscular tissues appear normal without edematous change. Intramuscular fat planes are preserved. No soft tissue gas is identified. There is no bony destructive change or periosteal reaction. No fracture or other focal bony lesion is seen. The right  knee and ankle are unremarkable. IMPRESSION: Subcutaneous edema about the right lower leg is compatible with cellulitis or dependent change. Negative for abscess or evidence of osteomyelitis or myositis. Electronically Signed   By: Inge Rise M.D.   On: 11/11/2015 12:28    Microbiology: Recent Results (from the past 240 hour(s))  Culture, blood (routine x 2)     Status: None (Preliminary result)   Collection Time: 11/07/15  4:40 PM  Result Value Ref Range Status   Specimen Description BLOOD LEFT ANTECUBITAL  Final   Special Requests BOTTLES DRAWN AEROBIC AND ANAEROBIC 5CC EACH  Final   Culture   Final    NO GROWTH 3 DAYS Performed at [facility]    Report Status PENDING  Incomplete  Culture, blood (routine x 2)     Status: None (Preliminary result)  Collection Time: 11/07/15  4:45 PM  Result Value Ref Range Status   Specimen Description BLOOD RIGHT FOREARM  Final   Special Requests BOTTLES DRAWN AEROBIC AND ANAEROBIC 5CC EACH  Final   Culture   Final    NO GROWTH 3 DAYS Performed at [facility]    Report Status PENDING  Incomplete     Labs: Basic Metabolic Panel:  Recent Labs Lab 11/07/15 1657 11/08/15 0610 11/10/15 0609  NA 135 136 139  K 4.0 4.2 4.1  CL 98* 103 105  CO2 '22 24 26  ' GLUCOSE 184* 123* 110*  BUN 21* 18 17  CREATININE 1.63* 1.43* 1.38*  CALCIUM 9.3 8.6* 9.0   Liver Function Tests:  Recent Labs Lab 11/07/15 1657 11/08/15 0610  AST 31 23  ALT 32 23  ALKPHOS 71 60  BILITOT 1.0 1.3*  PROT 8.5* 7.1  ALBUMIN 3.9 3.3*   No results for input(s): LIPASE, AMYLASE in the last 168 hours. No results for input(s): AMMONIA in the last 168 hours. CBC:  Recent Labs Lab 11/07/15 1657 11/10/15 0609  WBC 13.6* 11.6*  NEUTROABS 9.8*  --   HGB 16.7 13.4  HCT 47.3 41.3  MCV 78.8 81.1  PLT 257 327   Cardiac Enzymes: No results for input(s): CKTOTAL, CKMB, CKMBINDEX, TROPONINI in the last 168 hours. BNP: BNP (last 3 results) No  results for input(s): BNP in the last 8760 hours.  ProBNP (last 3 results) No results for input(s): PROBNP in the last 8760 hours.  CBG:  Recent Labs Lab 11/10/15 1144 11/10/15 1633 11/10/15 2152 11/11/15 0755 11/11/15 1219  GLUCAP 96 101* 107* 91 91

## 2015-11-11 NOTE — Discharge Instructions (Signed)

## 2015-11-11 NOTE — Care Management Note (Signed)
Case Management Note  Patient Details  Name: Ralph Bradley MRN: [id number] Date of Birth: 14-Sep-1971  Subjective/Objective:      Cellulitis of right leg              Action/Plan: NCM spoke to pt and states he does not have a PCP. Pt has followed up in [facility]. Contacted office and arranged appt with Dr. Barton Fanny on 11/18/2015 at 2:15 pm. Pt has appt in March for a physical. Made pt aware of appt time and info placed on dc instructions. Pt states he can afford his medications. Wife at home to assist with his care.   Expected Discharge Date:  11/11/2015               Expected Discharge Plan:  Home/Self Care  In-House Referral:  NA  Discharge planning Services  CM Consult  Post Acute Care Choice:  NA Choice offered to:  NA  DME Arranged:  N/A DME Agency:  NA  HH Arranged:  NA HH Agency:  NA  Status of Service:  Completed, signed off  Medicare Important Message Given:    Date Medicare IM Given:    Medicare IM give by:    Date Additional Medicare IM Given:    Additional Medicare Important Message give by:     If discussed at Long Length of Stay Meetings, dates discussed:    Additional Comments:  Elliot Cousin, RN 11/11/2015, 1:31 PM

## 2015-11-12 LAB — CULTURE, BLOOD (ROUTINE X 2)
Culture: NO GROWTH
Culture: NO GROWTH

## 2018-10-05 DIAGNOSIS — E119 Type 2 diabetes mellitus without complications: Secondary | ICD-10-CM | POA: Diagnosis not present

## 2018-11-05 DIAGNOSIS — E119 Type 2 diabetes mellitus without complications: Secondary | ICD-10-CM | POA: Diagnosis not present

## 2018-11-11 DIAGNOSIS — E119 Type 2 diabetes mellitus without complications: Secondary | ICD-10-CM | POA: Diagnosis not present

## 2018-11-11 DIAGNOSIS — G250 Essential tremor: Secondary | ICD-10-CM | POA: Diagnosis not present

## 2018-11-11 DIAGNOSIS — L309 Dermatitis, unspecified: Secondary | ICD-10-CM | POA: Diagnosis not present

## 2019-10-23 ENCOUNTER — Other Ambulatory Visit: Payer: Self-pay

## 2019-10-23 ENCOUNTER — Ambulatory Visit (INDEPENDENT_AMBULATORY_CARE_PROVIDER_SITE_OTHER): Payer: Commercial Managed Care - PPO

## 2019-10-23 ENCOUNTER — Ambulatory Visit (HOSPITAL_COMMUNITY)
Admission: EM | Admit: 2019-10-23 | Discharge: 2019-10-23 | Disposition: A | Discharge status: Home / Self Care | Payer: Commercial Managed Care - PPO | Attending: Nurse Practitioner | Admitting: Nurse Practitioner

## 2019-10-23 ENCOUNTER — Encounter (HOSPITAL_COMMUNITY): Payer: Self-pay | Admitting: Emergency Medicine

## 2019-10-23 DIAGNOSIS — M25511 Pain in right shoulder: Secondary | ICD-10-CM | POA: Diagnosis not present

## 2019-10-23 MED ORDER — MELOXICAM 15 MG PO TABS: 15.0000 mg | ORAL_TABLET | Freq: Every day | ORAL | 0 refills | Status: AC

## 2019-10-23 MED ORDER — LIDOCAINE 5 % EX PTCH: 1.0000 | MEDICATED_PATCH | CUTANEOUS | 0 refills | Status: AC

## 2019-10-23 NOTE — ED Provider Notes (Signed)
[facility]    CSN: [id number] Arrival date & time: 10/23/19  1701      History   Chief Complaint Chief Complaint  Patient presents with  . Appointment  . Shoulder Pain    HPI Ralph Bradley is a 49 y.o. male.   History of Present Illness  Ralph Bradley is a 49 y.o. male patient that complains of pain in the right shoulder. Onset of symptoms was gradual starting several months ago but has been worse over the past couple of weeks. There has not been any injury or trauma. here is is not a history of a previous shoulder injury. Patient describes pain as aching and throbbing. Pain severity now is 4 /10. The pain does not radiate. Pain is aggravated by movement, use and palpation. Pain is alleviated by nothing. Patient denies any numbness, tingling, weakness, loss of sensation, loss of motion or inability to bear weight to the right upper extremity. Care prior to arrival consisted of rest and occasional NSAIDs, with minimal relief.        Past Medical History:  Diagnosis Date  . Diabetes mellitus without complication [facility])     Patient Active Problem List   Diagnosis Date Noted  . Cellulitis of right lower extremity   . Controlled type 2 diabetes mellitus without complication, without long-term current use of insulin ([place])   . Leukocytosis   . Cellulitis 11/07/2015  . Cellulitis and abscess of leg 11/07/2015  . Diabetes mellitus, type 2 ([place]) 11/07/2015  . Acute kidney failure ([place]) 11/07/2015    Past Surgical History:  Procedure Laterality Date  . WISDOM TOOTH EXTRACTION     20 years ago       Home Medications    Prior to Admission medications   Medication Sig Start Date End Date Taking? Authorizing Provider  metFORMIN (GLUCOPHAGE) 1000 MG tablet Take 1,000 mg by mouth 2 (two) times daily. 08/26/15  Yes [provider]  lidocaine (LIDODERM) 5 % Place 1 patch onto the skin daily. Remove & Discard patch within 12 hours or as directed by MD  10/23/19   Enrique Sack, FNP  meloxicam (MOBIC) 15 MG tablet Take 1 tablet (15 mg total) by mouth daily. 10/23/19 11/22/19  Enrique Sack, FNP    Family History Family History  Problem Relation Age of Onset  . Diabetes Mellitus II Mother     Social History Social History   Tobacco Use  . Smoking status: Never Smoker  . Smokeless tobacco: Never Used  Substance Use Topics  . Alcohol use: No  . Drug use: No     Allergies   Patient has no known allergies.   Review of Systems Review of Systems  Musculoskeletal: Positive for arthralgias.  All other systems reviewed and are negative.    Physical Exam Triage Vital Signs ED Triage Vitals  Enc Vitals Group     BP 10/23/19 1721 115/84     Pulse Rate 10/23/19 1719 89     Resp 10/23/19 1719 16     Temp 10/23/19 1719 98.8 F (37.1 C)     Temp Source 10/23/19 1719 Oral     SpO2 10/23/19 1721 97 %     Weight --      Height --      Head Circumference --      Peak Flow --      Pain Score 10/23/19 1718 4     Pain Loc --      Pain Edu? --  Excl. in GC? --    No data found.  Updated Vital Signs BP 115/84   Pulse 89   Temp 98.8 F (37.1 C) (Oral)   Resp 16   SpO2 97%   Visual Acuity Right Eye Distance:   Left Eye Distance:   Bilateral Distance:    Right Eye Near:   Left Eye Near:    Bilateral Near:     Physical Exam Vitals reviewed.  Constitutional:      Appearance: Normal appearance.  Cardiovascular:     Rate and Rhythm: Normal rate.  Pulmonary:     Effort: Pulmonary effort is normal.  Musculoskeletal:        General: Normal range of motion.     Right shoulder: Tenderness present. No swelling, deformity or effusion. Normal range of motion. Normal strength.     Left shoulder: Normal.     Cervical back: Full passive range of motion without pain and normal range of motion.  Skin:    General: Skin is warm and dry.  Neurological:     General: No focal deficit present.     Mental Status: He is  alert and oriented to person, place, and time.      UC Treatments / Results  Labs (all labs ordered are listed, but only abnormal results are displayed) Labs Reviewed - No data to display  EKG   Radiology DG Shoulder Right  Result Date: 10/23/2019 CLINICAL DATA:  49 year old male with right shoulder discomfort. No known injury. EXAM: RIGHT SHOULDER - 2+ VIEW COMPARISON:  None. FINDINGS: There is no acute fracture or dislocation. No significant arthritic changes of the glenohumeral joint. There is trabecular coarsening of the lateral humeral head with associated mild cortical thinning concerning for a bone lesion, possibly a giant cell tumor. No cortical breakage or periosteal elevation or other aggressive features. Further evaluation with MRI without and with contrast on a nonemergent basis recommended. The soft tissues are unremarkable. IMPRESSION: 1. No acute fracture or dislocation. 2. Probable predominantly lucent lesion involving the right humeral head with no aggressive features by radiograph. Further evaluation with MRI without and with contrast on a nonemergent basis recommended. Electronically Signed   By: Elgie Collard M.D.   On: 10/23/2019 17:57    Procedures Procedures (including critical care time)  Medications Ordered in UC Medications - No data to display  Initial Impression / Assessment and Plan / UC Course  I have reviewed the triage vital signs and the nursing notes.  Pertinent labs & imaging results that were available during my care of the patient were reviewed by me and considered in my medical decision making (see chart for details).     49 yo male presenting with worsening right shoulder pain. No injury or trauma reported. X-ray shows trabecular coarsening of  the lateral humeral head with associated mild cortical thinning concerning for a bone lesion, possibly a giant cell tumor. Mobic and lidocaine patches prescribed. Patient advised to avoid push ups and  any other weight bearing activities to the shoulder that causes the most discomfort. Alternate between ice and heat. Follow-up with ortho for MRI and further eval/management.   Today's evaluation has revealed no signs of a dangerous process. Discussed diagnosis with patient and/or guardian. Patient and/or guardian aware of their diagnosis, possible red flag symptoms to watch out for and need for close follow up. Patient and/or guardian understands verbal and written discharge instructions. Patient and/or guardian comfortable with plan and disposition.  Patient and/or guardian has  a clear mental status at this time, good insight into illness (after discussion and teaching) and has clear judgment to make decisions regarding their care  This care was provided during an unprecedented National Emergency due to the Novel Coronavirus (COVID-19) pandemic. COVID-19 infections and transmission risks place heavy strains on healthcare resources.  As this pandemic evolves, our facility, providers, and staff strive to respond fluidly, to remain operational, and to provide care relative to available resources and information. Outcomes are unpredictable and treatments are without well-defined guidelines. Further, the impact of COVID-19 on all aspects of urgent care, including the impact to patients seeking care for reasons other than COVID-19, is unavoidable during this national emergency. At this time of the global pandemic, management of patients has significantly changed, even for non-COVID positive patients given high local and regional COVID volumes at this time requiring high healthcare system and resource utilization. The standard of care for management of both COVID suspected and non-COVID suspected patients continues to change rapidly at the local, regional, national, and global levels. This patient was worked up and treated to the best available but ever changing evidence and resources available at this current time.    Documentation was completed with the aid of voice recognition software. Transcription may contain typographical errors. Final Clinical Impressions(s) / UC Diagnoses   Final diagnoses:  Acute pain of right shoulder     Discharge Instructions     Take medications as prescribed. Avoid activities that will aggravate the pain in the shoulder. Follow-up with ortho as soon as possible for MRI and further evaluation.       ED Prescriptions    Medication Sig Dispense Auth. Provider   meloxicam (MOBIC) 15 MG tablet Take 1 tablet (15 mg total) by mouth daily. 30 tablet Catrinia Racicot, [place], FNP   lidocaine (LIDODERM) 5 % Place 1 patch onto the skin daily. Remove & Discard patch within 12 hours or as directed by MD 30 patch Lurline Idol, FNP     PDMP not reviewed this encounter.   Lurline Idol, [place] 10/23/19 1842

## 2019-10-23 NOTE — Discharge Instructions (Signed)
Take medications as prescribed. Avoid activities that will aggravate the pain in the shoulder. Follow-up with ortho as soon as possible for MRI and further evaluation.

## 2019-10-23 NOTE — ED Triage Notes (Signed)
R shoulder pain, 2 weeks, no injury.

## 2019-11-09 ENCOUNTER — Other Ambulatory Visit: Payer: Self-pay | Admitting: Orthopedic Surgery

## 2019-11-09 ENCOUNTER — Other Ambulatory Visit (HOSPITAL_COMMUNITY): Payer: Self-pay | Admitting: Orthopedic Surgery

## 2019-11-09 DIAGNOSIS — M25511 Pain in right shoulder: Secondary | ICD-10-CM

## 2019-12-04 ENCOUNTER — Other Ambulatory Visit: Payer: Self-pay

## 2019-12-04 ENCOUNTER — Ambulatory Visit (HOSPITAL_COMMUNITY)
Admission: RE | Admit: 2019-12-04 | Discharge: 2019-12-04 | Disposition: A | Discharge status: Home / Self Care | Payer: Commercial Managed Care - PPO | Source: Ambulatory Visit | Attending: Orthopedic Surgery | Admitting: Orthopedic Surgery

## 2019-12-04 ENCOUNTER — Encounter (HOSPITAL_COMMUNITY): Payer: Self-pay

## 2019-12-04 DIAGNOSIS — M25511 Pain in right shoulder: Secondary | ICD-10-CM | POA: Diagnosis not present

## 2019-12-04 MED ORDER — GADOBUTROL 1 MMOL/ML IV SOLN
10.0000 mL | Freq: Once | INTRAVENOUS | Status: AC | PRN
  Administered 2019-12-04: 10 mL via INTRAVENOUS

## 2020-06-19 ENCOUNTER — Other Ambulatory Visit: Payer: Self-pay | Admitting: Physician Assistant

## 2020-06-19 DIAGNOSIS — N5089 Other specified disorders of the male genital organs: Secondary | ICD-10-CM

## 2020-06-21 ENCOUNTER — Ambulatory Visit
Admission: RE | Admit: 2020-06-21 | Discharge: 2020-06-21 | Disposition: A | Discharge status: Home / Self Care | Payer: Commercial Managed Care - PPO | Source: Ambulatory Visit | Attending: Physician Assistant | Admitting: Physician Assistant

## 2020-06-21 DIAGNOSIS — N5089 Other specified disorders of the male genital organs: Secondary | ICD-10-CM

## 2021-05-04 IMAGING — DX DG SHOULDER 2+V*R*
3 series · 3 of 3 positions shown · non-contrast
Comparison: None.

CLINICAL DATA: 48-year-old male with right shoulder discomfort. No
known injury.

EXAM:
RIGHT SHOULDER - 2+ VIEW

[shoulder grashey]
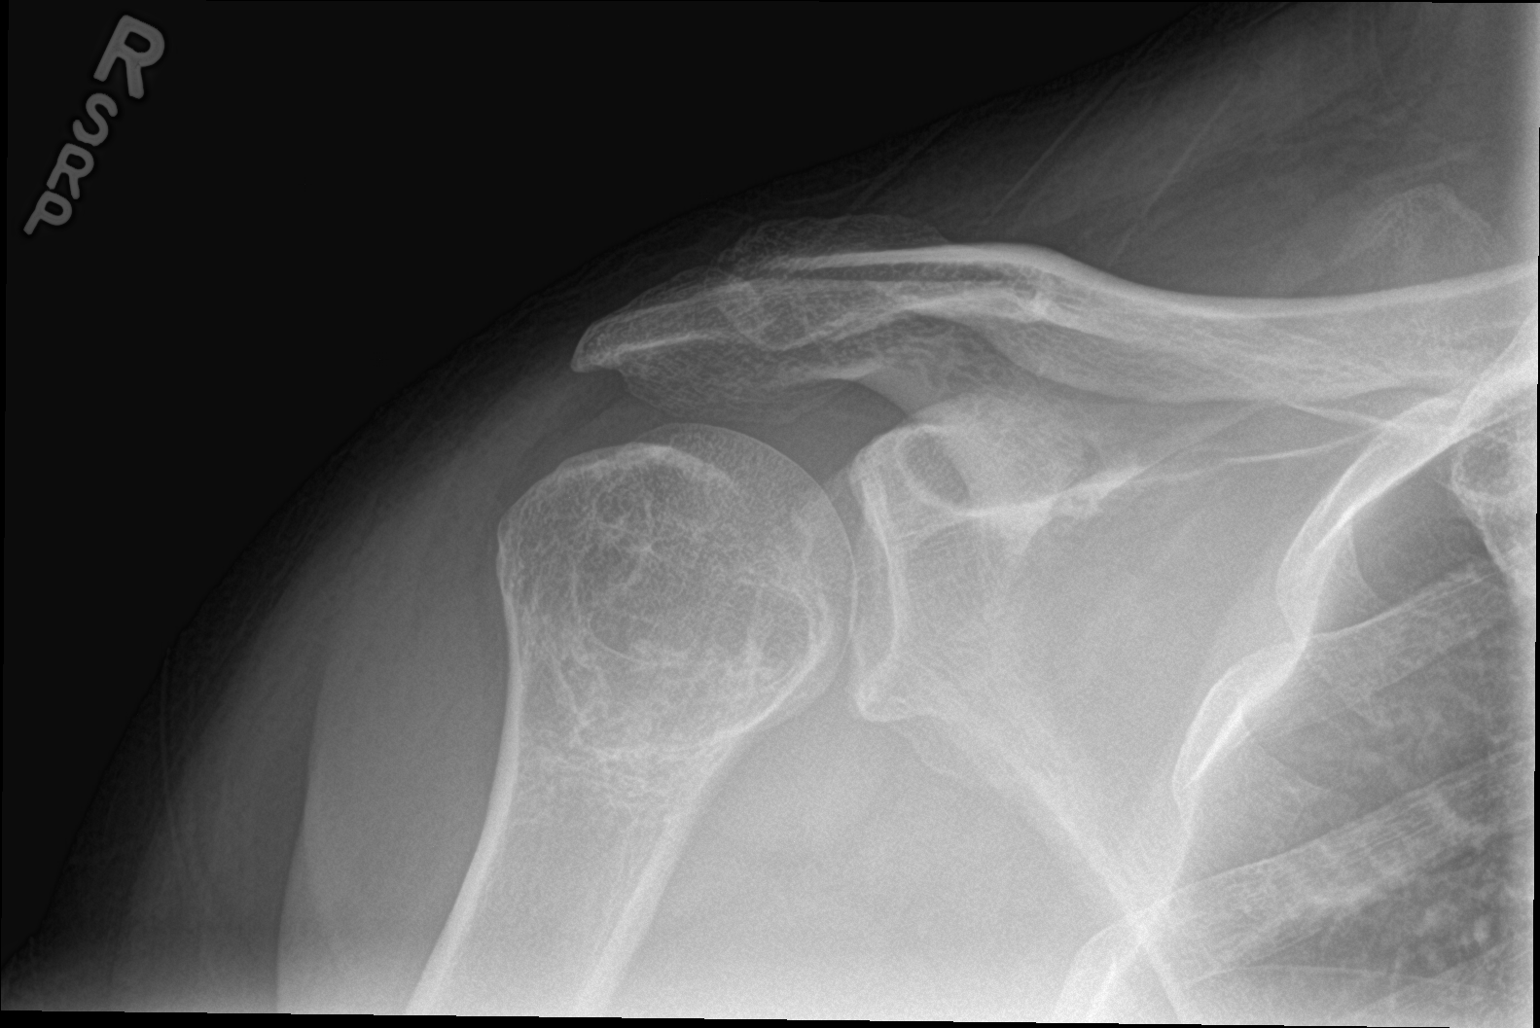

[shoulder axial]
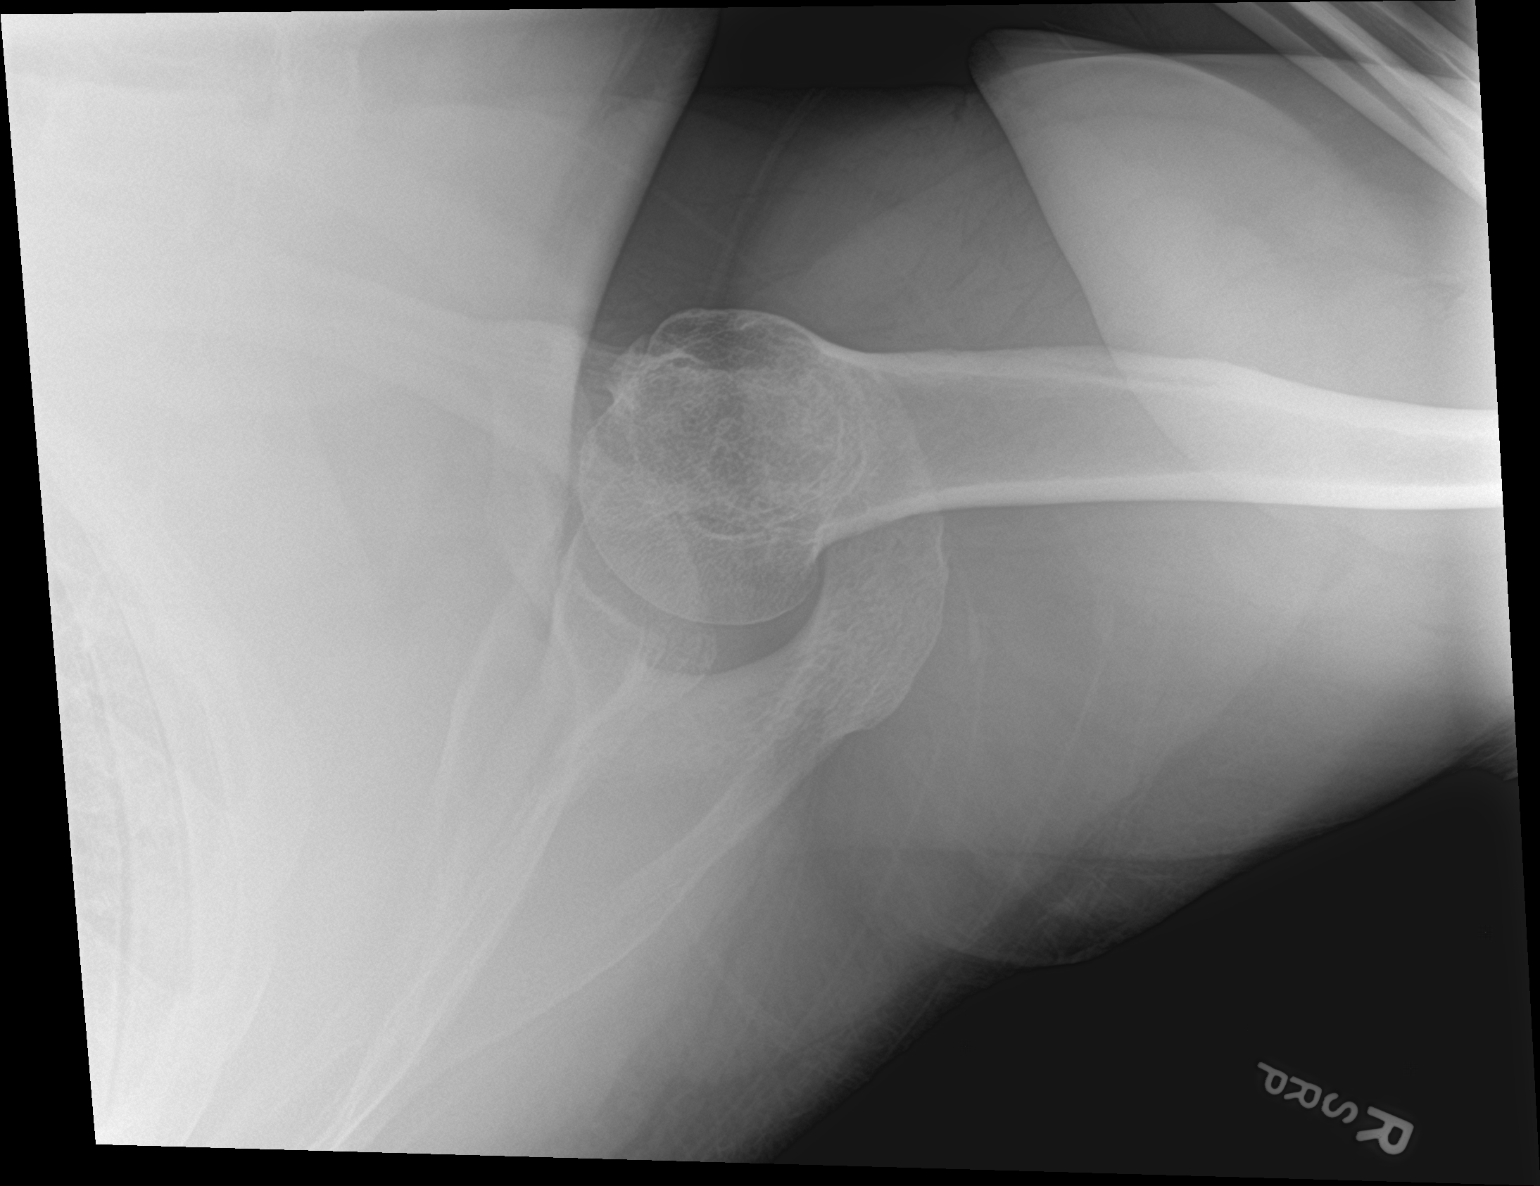

[shoulder y-view]
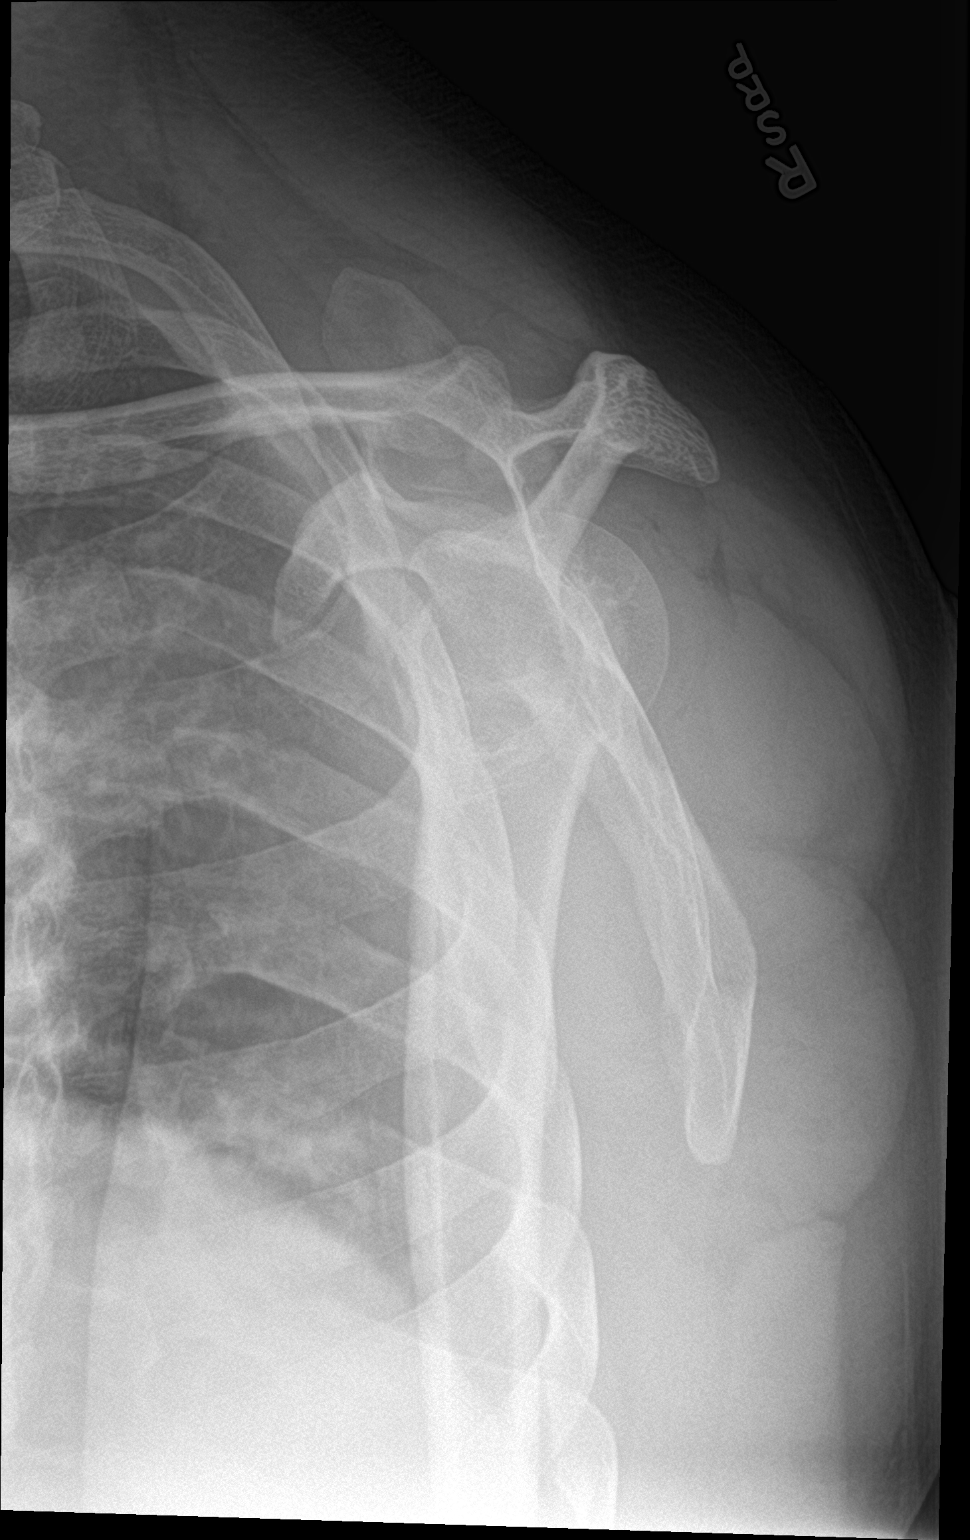

[3 of 3 positions shown; findings below may reference images not displayed]

FINDINGS: There is no acute fracture or dislocation. No significant arthritic
changes of the glenohumeral joint. There is trabecular coarsening of
the lateral humeral head with associated mild cortical thinning
concerning for a bone lesion, possibly a giant cell tumor. No
cortical breakage or periosteal elevation or other aggressive
features. Further evaluation with MRI without and with contrast on a
nonemergent basis recommended. The soft tissues are unremarkable.
IMPRESSION: 1. No acute fracture or dislocation.
2. Probable predominantly lucent lesion involving the right humeral
head with no aggressive features by radiograph. Further evaluation
with MRI without and with contrast on a nonemergent basis
recommended.

## 2024-11-01 ENCOUNTER — Encounter (HOSPITAL_BASED_OUTPATIENT_CLINIC_OR_DEPARTMENT_OTHER): Payer: Self-pay | Admitting: Orthopaedic Surgery

## 2024-11-06 ENCOUNTER — Encounter (HOSPITAL_BASED_OUTPATIENT_CLINIC_OR_DEPARTMENT_OTHER)
Admission: RE | Admit: 2024-11-06 | Discharge: 2024-11-06 | Disposition: A | Source: Ambulatory Visit | Attending: Orthopaedic Surgery

## 2024-11-06 DIAGNOSIS — E119 Type 2 diabetes mellitus without complications: Secondary | ICD-10-CM | POA: Insufficient documentation

## 2024-11-06 DIAGNOSIS — Z01812 Encounter for preprocedural laboratory examination: Secondary | ICD-10-CM | POA: Insufficient documentation

## 2024-11-06 LAB — BASIC METABOLIC PANEL WITH GFR
Anion gap: 10 (ref 5–15)
BUN: 11 mg/dL (ref 6–20)
CO2: 27 mmol/L (ref 22–32)
Calcium: 9.5 mg/dL (ref 8.9–10.3)
Chloride: 102 mmol/L (ref 98–111)
Creatinine, Ser: 1.33 mg/dL — ABNORMAL HIGH (ref 0.61–1.24)
GFR, Estimated: >60 mL/min
Glucose, Bld: 134 mg/dL — ABNORMAL HIGH (ref 70–99)
Potassium: 5 mmol/L (ref 3.5–5.1)
Sodium: 138 mmol/L (ref 135–145)

## 2024-11-06 NOTE — H&P (Signed)
 ORTHOPAEDIC SURGERY H&P  Subjective:  The patient presents for left ankle arthroscopy, open lateral ankle ligament stabilization, peroneal tendon debridement with possible repair and/or tendon transfer.   Past Medical History:  Diagnosis Date   Diabetes mellitus without complication (HCC)    Sleep apnea     Past Surgical History:  Procedure Laterality Date   WISDOM TOOTH EXTRACTION     20 years ago     Show/hide medication list[1]   Allergies[2]  Social History   Socioeconomic History   Marital status: Married    Spouse name: Not on file   Number of children: Not on file   Years of education: Not on file   Highest education level: Not on file  Occupational History   Not on file  Tobacco Use   Smoking status: Never   Smokeless tobacco: Never  Substance and Sexual Activity   Alcohol use: No   Drug use: No   Sexual activity: Yes  Other Topics Concern   Not on file  Social History Narrative   Not on file   Social Drivers of Health   Tobacco Use: Low Risk (11/01/2024)   Patient History    Smoking Tobacco Use: Never    Smokeless Tobacco Use: Never    Passive Exposure: Not on file  Financial Resource Strain: Low Risk (01/19/2024)   Received from [facility]   Overall Financial Resource Strain (CARDIA)    Difficulty of Paying Living Expenses: Not hard at all  Food Insecurity: No Food Insecurity (01/19/2024)   Received from [facility]   Epic    Within the past 12 months, you worried that your food would run out before you got the money to buy more.: Never true    Within the past 12 months, the food you bought just didn't last and you didn't have money to get more.: Never true  Transportation Needs: No Transportation Needs (01/19/2024)   Received from [facility] - Transportation    Lack of Transportation (Medical): No    Lack of Transportation (Non-Medical): No  Physical Activity: Insufficiently Active (01/19/2024)   Received from [facility]    Exercise Vital Sign    On average, how many days per week do you engage in moderate to strenuous exercise (like a brisk walk)?: 3 days    On average, how many minutes do you engage in exercise at this level?: 40 min  Stress: No Stress Concern Present (01/19/2024)   Received from [facility] - Occupational Stress Questionnaire    Feeling of Stress : Only a little  Social Connections: Socially Isolated (01/19/2024)   Received from [facility]   Social Network    How would you rate your social network (family, work, friends)?: Little participation, lonely and socially isolated  Intimate Partner Violence: Not At Risk (01/19/2024)   Received from [facility]   HITS    Over the last 12 months how often did your partner physically hurt you?: Never    Over the last 12 months how often did your partner insult you or talk down to you?: Rarely    Over the last 12 months how often did your partner threaten you with physical harm?: Never    Over the last 12 months how often did your partner scream or curse at you?: Sometimes  Depression (PHQ2-9): Not on file  Alcohol Screen: Not on file  Housing: Low Risk (01/19/2024)   Received from [facility]  Epic    In the last 12 months, was there a time when you were not able to pay the mortgage or rent on time?: No    In the past 12 months, how many times have you moved where you were living?: 0    At any time in the past 12 months, were you homeless or living in a shelter (including now)?: No  Utilities: Not At Risk (01/19/2024)   Received from [facility] Utilities    Threatened with loss of utilities: No  Health Literacy: Not on file     History reviewed. No pertinent family history.   Review of Systems Pertinent items are noted in HPI.  Objective: Vital signs in last 24 hours:    11/01/2024    1:30 PM 10/23/2019    5:21 PM 10/23/2019    5:19 PM  Vitals with BMI  Height 5' 8    Weight  220 lbs    BMI 33.46    Systolic  115   Diastolic  84   Pulse   89      EXAM: General: Well nourished, well developed. Awake, alert and oriented to time, place, person. Normal mood and affect. No apparent distress. Breathing room air.  Operative Lower Extremity: Alignment - Neutral Deformity - None Skin intact Tenderness to palpation - left lateral hindfoot 5/5 TA, PT, GS, Per, EHL, FHL Sensation intact to light touch throughout Palpable DP and PT pulses Special testing: None  The contralateral foot/ankle was examined for comparison and noted to be neurovascularly intact with no localized deformity, swelling, or tenderness.  Imaging Review All images taken were independently reviewed by me.  Assessment/Plan: The clinical and radiographic findings were reviewed and discussed at length with the patient.  The patient presents for left ankle arthroscopy, open lateral ankle ligament stabilization, peroneal tendon debridement with possible repair and/or tendon transfer.  We spoke at length about the natural course of these findings. We discussed nonoperative and operative treatment options in detail.  The risks and benefits were presented and reviewed. The risks due to hardware/suture failure and/or irritation (if removing hardware: inability to remove part/all of hardware, recurrent instability), new/persistent infection, stiffness, nerve/vessel/tendon injury or rerupture of repaired tendon, nonunion/malunion, allograft usage, wound healing issues, development of arthritis, failure of this surgery, possibility of external fixation with delayed definitive surgery, need for further surgery, thromboembolic events, anesthesia/medical complications, amputation, death among others were discussed.  Ralph Bradley  Orthopaedic Surgery [facility]     [1] (Not in an outpatient encounter) [2] No Known Allergies

## 2024-11-06 NOTE — Discharge Instructions (Signed)
 Lillia Mountain, MD [facility]  Please read the following information regarding your care after surgery.  Medications   - Oxycodone  5 mg every 6 hours as needed for pain - Aspirin  81 mg twice daily as scheduled to prevent blood clots - Colace 100 mg twice daily as needed for constipation - Zofran  4 mg every 8 hours as needed for nausea/vomiting  We send above prescriptions to your pharmacy on file.  In addition you may also use: ? acetaminophen  (Tylenol ) 500 mg every 4-6 hours as you need for minor to moderate pain  Resume all other routine medications per usual or as directed by your PCP/other specialists.  Weight Bearing ? Do NOT bear any weight on the operated leg or foot. This means do NOT touch your surgical leg to the ground!  Cast / Splint / Dressing ? If you have a splint, do NOT remove this. Keep your splint, cast or dressing clean and dry.  Don't put anything (coat hanger, pencil, etc) down inside of it.  If it gets wet, call the office immediately to schedule an appointment for a cast change.  Swelling IMPORTANT: It is normal for you to have swelling where you had surgery. To reduce swelling and pain, keep at least 3 pillows under your leg so that your toes are above your nose and your heel is above the level of your hip.  It may be necessary to keep your foot or leg elevated for several weeks.  This is critical to helping your incisions heal and your pain to feel better.  Follow Up Call my office at [phone] when you are discharged from the hospital or surgery center to schedule an appointment to be seen 7-10 days after surgery.  Call my office at [phone] if you develop a fever >101.5 F, nausea, vomiting, bleeding from the surgical site or severe pain.    No Tylenol  before 8:30pm tonight.   Post Anesthesia Home Care Instructions  Activity: Get plenty of rest for the remainder of the day. A responsible individual must stay with you for 24 hours following  the procedure.  For the next 24 hours, DO NOT: -Drive a car -Advertising copywriter -Drink alcoholic beverages -Take any medication unless instructed by your physician -Make any legal decisions or sign important papers.  Meals: Start with liquid foods such as gelatin or soup. Progress to regular foods as tolerated. Avoid greasy, spicy, heavy foods. If nausea and/or vomiting occur, drink only clear liquids until the nausea and/or vomiting subsides. Call your physician if vomiting continues.  Special Instructions/Symptoms: Your throat may feel dry or sore from the anesthesia or the breathing tube placed in your throat during surgery. If this causes discomfort, gargle with warm salt water. The discomfort should disappear within 24 hours.  If you had a scopolamine  patch placed behind your ear for the management of post- operative nausea and/or vomiting:  1. The medication in the patch is effective for 72 hours, after which it should be removed.  Wrap patch in a tissue and discard in the trash. Wash hands thoroughly with soap and water. 2. You may remove the patch earlier than 72 hours if you experience unpleasant side effects which may include dry mouth, dizziness or visual disturbances. 3. Avoid touching the patch. Wash your hands with soap and water after contact with the patch.   Regional Anesthesia Blocks  1. You may not be able to move or feel the blocked extremity after a regional anesthetic block. This may last may  last from 3-48 hours after placement, but it will go away. The length of time depends on the medication injected and your individual response to the medication. As the nerves start to wake up, you may experience tingling as the movement and feeling returns to your extremity. If the numbness and inability to move your extremity has not gone away after 48 hours, please call your surgeon.   2. The extremity that is blocked will need to be protected until the numbness is gone and the  strength has returned. Because you cannot feel it, you will need to take extra care to avoid injury. Because it may be weak, you may have difficulty moving it or using it. You may not know what position it is in without looking at it while the block is in effect.  3. For blocks in the legs and feet, returning to weight bearing and walking needs to be done carefully. You will need to wait until the numbness is entirely gone and the strength has returned. You should be able to move your leg and foot normally before you try and bear weight or walk. You will need someone to be with you when you first try to ensure you do not fall and possibly risk injury.  4. Bruising and tenderness at the needle site are common side effects and will resolve in a few days.  5. Persistent numbness or new problems with movement should be communicated to the surgeon or the [facility] [phone] [facility] [phone]).

## 2024-11-08 ENCOUNTER — Ambulatory Visit (HOSPITAL_BASED_OUTPATIENT_CLINIC_OR_DEPARTMENT_OTHER)
Admission: RE | Admit: 2024-11-08 | Discharge: 2024-11-08 | Disposition: A | Source: Home / Self Care | Attending: Orthopaedic Surgery | Admitting: Orthopaedic Surgery

## 2024-11-08 ENCOUNTER — Ambulatory Visit (HOSPITAL_BASED_OUTPATIENT_CLINIC_OR_DEPARTMENT_OTHER): Admitting: Certified Registered"

## 2024-11-08 ENCOUNTER — Other Ambulatory Visit: Payer: Self-pay

## 2024-11-08 ENCOUNTER — Encounter (HOSPITAL_BASED_OUTPATIENT_CLINIC_OR_DEPARTMENT_OTHER): Payer: Self-pay | Admitting: Orthopaedic Surgery

## 2024-11-08 ENCOUNTER — Encounter (HOSPITAL_BASED_OUTPATIENT_CLINIC_OR_DEPARTMENT_OTHER): Admission: RE | Disposition: A | Payer: Self-pay | Source: Home / Self Care | Attending: Orthopaedic Surgery

## 2024-11-08 DIAGNOSIS — E119 Type 2 diabetes mellitus without complications: Secondary | ICD-10-CM

## 2024-11-08 HISTORY — DX: Sleep apnea, unspecified: G47.30

## 2024-11-08 LAB — GLUCOSE, CAPILLARY
Glucose-Capillary: 164 mg/dL — ABNORMAL HIGH (ref 70–99)
Glucose-Capillary: 180 mg/dL — ABNORMAL HIGH (ref 70–99)

## 2024-11-08 MED ORDER — LIDOCAINE 2% (20 MG/ML) 5 ML SYRINGE
INTRAMUSCULAR | Status: DC | PRN
  Administered 2024-11-08: 60 mg via INTRAVENOUS

## 2024-11-08 MED ORDER — MIDAZOLAM HCL 2 MG/2ML IJ SOLN
INTRAMUSCULAR | Status: AC
  Filled 2024-11-08: qty 2

## 2024-11-08 MED ORDER — CHLORHEXIDINE GLUCONATE 4 % EX SOLN: 60.0000 mL | Freq: Once | CUTANEOUS | Status: DC

## 2024-11-08 MED ORDER — ONDANSETRON HCL 4 MG/2ML IJ SOLN
INTRAMUSCULAR | Status: AC
  Filled 2024-11-08: qty 2

## 2024-11-08 MED ORDER — FENTANYL CITRATE (PF) 100 MCG/2ML IJ SOLN
50.0000 ug | Freq: Once | INTRAMUSCULAR | Status: AC
  Administered 2024-11-08: 50 ug via INTRAVENOUS

## 2024-11-08 MED ORDER — ACETAMINOPHEN 10 MG/ML IV SOLN: 1000.0000 mg | Freq: Once | INTRAVENOUS | Status: DC | PRN

## 2024-11-08 MED ORDER — CEFAZOLIN SODIUM-DEXTROSE 2-4 GM/100ML-% IV SOLN
2.0000 g | INTRAVENOUS | Status: AC
  Administered 2024-11-08: 2 g via INTRAVENOUS

## 2024-11-08 MED ORDER — CEFAZOLIN SODIUM-DEXTROSE 2-4 GM/100ML-% IV SOLN
INTRAVENOUS | Status: AC
  Filled 2024-11-08: qty 100

## 2024-11-08 MED ORDER — ACETAMINOPHEN 500 MG PO TABS
1000.0000 mg | ORAL_TABLET | Freq: Once | ORAL | Status: AC
  Administered 2024-11-08: 1000 mg via ORAL

## 2024-11-08 MED ORDER — PROPOFOL 10 MG/ML IV BOLUS
INTRAVENOUS | Status: AC
  Filled 2024-11-08: qty 20

## 2024-11-08 MED ORDER — SODIUM CHLORIDE (PF) 0.9 % IJ SOLN
INTRAMUSCULAR | Status: AC
  Filled 2024-11-08: qty 200

## 2024-11-08 MED ORDER — VANCOMYCIN HCL 1000 MG IV SOLR
INTRAVENOUS | Status: AC
  Filled 2024-11-08: qty 60

## 2024-11-08 MED ORDER — FENTANYL CITRATE (PF) 100 MCG/2ML IJ SOLN
INTRAMUSCULAR | Status: AC
  Filled 2024-11-08: qty 2

## 2024-11-08 MED ORDER — DEXAMETHASONE SOD PHOSPHATE PF 10 MG/ML IJ SOLN
INTRAMUSCULAR | Status: DC | PRN
  Administered 2024-11-08: 10 mg via INTRAVENOUS

## 2024-11-08 MED ORDER — DROPERIDOL 2.5 MG/ML IJ SOLN: 0.6250 mg | Freq: Once | INTRAMUSCULAR | Status: DC | PRN

## 2024-11-08 MED ORDER — PROPOFOL 10 MG/ML IV BOLUS
INTRAVENOUS | Status: DC | PRN
  Administered 2024-11-08: 200 mg via INTRAVENOUS

## 2024-11-08 MED ORDER — 0.9 % SODIUM CHLORIDE (POUR BTL) OPTIME
TOPICAL | Status: DC | PRN
  Administered 2024-11-08: 1000 mL

## 2024-11-08 MED ORDER — BUPIVACAINE-EPINEPHRINE (PF) 0.5% -1:200000 IJ SOLN
INTRAMUSCULAR | Status: AC
  Filled 2024-11-08: qty 150

## 2024-11-08 MED ORDER — SODIUM CHLORIDE (PF) 0.9 % IJ SOLN
INTRAMUSCULAR | Status: AC
  Filled 2024-11-08: qty 50

## 2024-11-08 MED ORDER — LACTATED RINGERS IV SOLN: INTRAVENOUS | Status: DC

## 2024-11-08 MED ORDER — MIDAZOLAM HCL (PF) 2 MG/2ML IJ SOLN
2.0000 mg | Freq: Once | INTRAMUSCULAR | Status: AC
  Administered 2024-11-08: 2 mg via INTRAVENOUS

## 2024-11-08 MED ORDER — BUPIVACAINE HCL (PF) 0.25 % IJ SOLN
INTRAMUSCULAR | Status: AC
  Filled 2024-11-08: qty 150

## 2024-11-08 MED ORDER — OXYCODONE HCL 5 MG/5ML PO SOLN: 5.0000 mg | Freq: Once | ORAL | Status: DC | PRN

## 2024-11-08 MED ORDER — BUPIVACAINE-EPINEPHRINE (PF) 0.5% -1:200000 IJ SOLN
INTRAMUSCULAR | Status: DC | PRN
  Administered 2024-11-08: 20 mL

## 2024-11-08 MED ORDER — ACETAMINOPHEN 500 MG PO TABS
ORAL_TABLET | ORAL | Status: AC
  Filled 2024-11-08: qty 2

## 2024-11-08 MED ORDER — ONDANSETRON HCL 4 MG/2ML IJ SOLN
INTRAMUSCULAR | Status: DC | PRN
  Administered 2024-11-08: 4 mg via INTRAVENOUS

## 2024-11-08 MED ORDER — FENTANYL CITRATE (PF) 100 MCG/2ML IJ SOLN: 25.0000 ug | INTRAMUSCULAR | Status: DC | PRN

## 2024-11-08 MED ORDER — LIDOCAINE 2% (20 MG/ML) 5 ML SYRINGE
INTRAMUSCULAR | Status: AC
  Filled 2024-11-08: qty 5

## 2024-11-08 MED ORDER — ROPIVACAINE HCL 5 MG/ML IJ SOLN
INTRAMUSCULAR | Status: DC | PRN
  Administered 2024-11-08: 15 mL via PERINEURAL

## 2024-11-08 MED ORDER — OXYCODONE HCL 5 MG PO TABS: 5.0000 mg | ORAL_TABLET | Freq: Once | ORAL | Status: DC | PRN

## 2024-11-08 NOTE — Anesthesia Postprocedure Evaluation (Signed)
"   Anesthesia Post Note  Patient: Ralph Bradley  Procedure(s) Performed: ARTHROSCOPY, ANKLE WITH DEBRIDEMENT (Left) REPAIR, LIGAMENT (Left)     Patient location during evaluation: PACU Anesthesia Type: General Level of consciousness: awake and alert Pain management: pain level controlled Vital Signs Assessment: post-procedure vital signs reviewed and stable Respiratory status: spontaneous breathing, nonlabored ventilation, respiratory function stable and patient connected to nasal cannula oxygen Cardiovascular status: blood pressure returned to baseline and stable Postop Assessment: no apparent nausea or vomiting Anesthetic complications: no   No notable events documented.  Last Vitals:  Vitals:   11/08/24 1000 11/08/24 1021  BP: 118/81 (!) 141/87  Pulse: 97 82  Resp: 11 16  Temp:  (!) 36.4 C  SpO2: 98% 95%    Last Pain:  Vitals:   11/08/24 1021  TempSrc: Temporal  PainSc: 0-No pain                 [name]      "

## 2024-11-08 NOTE — H&P (Signed)
 H&P Update:  -History and Physical Reviewed  -Patient has been re-examined  -No change in the plan of care  -The risks and benefits were presented and reviewed. The risks due to hardware/suture failure and/or irritation (if removing hardware: inability to remove part/all of hardware, recurrent instability), new/persistent infection, stiffness, nerve/vessel/tendon injury or rerupture of repaired tendon, nonunion/malunion, allograft usage, wound healing issues, development of arthritis, failure of this surgery, possibility of external fixation with delayed definitive surgery, need for further surgery, thromboembolic events, anesthesia/medical complications, amputation, death among others were discussed. The patient acknowledged the explanation, agreed to proceed with the plan and a consent was signed.  Ralph Bradley

## 2024-11-08 NOTE — Op Note (Signed)
 11/08/2024  2:11 PM   PATIENT: Ralph Bradley  54 y.o. male  MRN: [id number]   PRE-OPERATIVE DIAGNOSIS:   Left ankle instability and peroneal tendinopathy   POST-OPERATIVE DIAGNOSIS:   Same   PROCEDURE: 1] Left ankle arthroscopic assisted debridement 2] Left ankle open lateral ankle ligament stabilization [place]) 3] Left ankle peroneus brevis tendon debridement and primary repair 4] Left ankle peroneus longus tendon debridement and tenosynovectomy   SURGEON:  Lillia Mountain, MD   ASSISTANT: None   ANESTHESIA: General, regional   EBL: Minimal   TOURNIQUET:    Total Tourniquet Time Documented: Thigh (Left) - 38 minutes Total: Thigh (Left) - 38 minutes    COMPLICATIONS: None apparent   DISPOSITION: Extubated, awake and stable to recovery.   INDICATION FOR PROCEDURE: The patient presented with above diagnosis.  We discussed the diagnosis, alternative treatment options, risks and benefits of the above surgical intervention, as well as alternative non-operative treatments. All questions/concerns were addressed and the patient/family demonstrated appropriate understanding of the diagnosis, the procedure, the postoperative course, and overall prognosis. The patient wished to proceed with surgical intervention and signed an informed surgical consent as such, in each others presence prior to surgery.   PROCEDURE IN DETAIL: After preoperative consent was obtained and the correct operative site was identified, the patient was brought to the operating room supine on stretcher. General anesthesia was induced. Preoperative antibiotics were administered. Surgical timeout was taken. The patient was then positioned supine. The operative lower extremity was prepped and draped in standard sterile fashion with a tourniquet around the thigh. The extremity was exsanguinated and the tourniquet was inflated to 275 mmHg.  Routine evaluation of the ankle joint demonstrated gross  lateral laxity with drawer testing. Full dorsiflexion as well as plantarflexion was possible.   We began by insufflating the ankle joint thru anteromedial approach. The anteromedial portal was carefully established medial to the tibialis anterior tendon. The arthroscopic trochar with blunt was inserted and then camera placed. There was excellent visualization of the joint and routine diagnostic ankle arthroscopy was performed. Of note, there was mild synovitis throughout the joint noted. Mild chondral changes in the tibiotalar joint surfaces. No loose bodies were encountered and no anterior ankle impingement was identified on max dorsiflexion. The deltoid and syndesmosis ligaments were stressed and noted to be stable. Arthroscopic assisted debridement of the ankle joint was performed.    A standard curvilinear approach was made over the lateral ankle ligament complex and the distal lateral malleolus.    We began by windowing the approach posteriorly to access the peroneal tendons. The sheath was incised and tenosynovial fluid was readily evacuated. We then sequentially evaluated both the peroneus longus and brevis tendons with tearing noted in the latter that was repaired primarily. We did note extensive tenosynovitis that was debrided thoroughly. There was no instability of peroneal tendons to intraoperative stress testing. The peroneal tendon sheath was closed with vicryl.    Dissection was the carried down to the level of the lateral ankle ligament complex. We sharply incised the capsule and the complex just distal to the tip of the fibula leaving a small cuff of tissue for repair after advancement. The proximal flap was elevated carefully off the lateral malleolus. We then used a rongeur to roughen the distal lateral malleolus. Two Arthrex FiberTak anchors were implanted using standard technique in the anatomic footprints of the ATFL and CFL ligaments. These were verified by manual stress to be well  seated within bone. The suture needles  were sequentially passed through the distal flap, tied, and then brought back proximally into the proximal flap were they were then tied.The foot was held in eversion throughout to set appropriate tension and protect the repair. Intraoperative ankle testing demonstrated improved stability of the newly reconstructed lateral ankle ligament complex.   The surgical sites were thoroughly irrigated. The tourniquet was deflated and hemostasis achieved. The deep layers were closed using 2-0 vicryl. The skin was closed without tension using 2-0 nylon suture.    The leg was cleaned with saline and sterile dressings with gauze were applied. A well padded bulky short leg splint was applied. The patient was awakened from anesthesia and transported to the recovery room in stable condition.    FOLLOW UP PLAN: -transfer to PACU, then home -strict NWB operative extremity, maximum elevation -maintain short leg splint until follow up -DVT ppx: Aspirin 81 mg twice daily -follow up as outpatient within 7-10 days for wound check -sutures out in 2-3 weeks in outpatient office   RADIOGRAPHS: None   Lillia Mountain [facility]

## 2024-11-08 NOTE — Transfer of Care (Signed)
 Immediate Anesthesia Transfer of Care Note  Patient: Ralph Bradley  Procedure(s) Performed: Procedures (LRB): ARTHROSCOPY, ANKLE WITH DEBRIDEMENT (Left) REPAIR, LIGAMENT (Left)  Patient Location: PACU  Anesthesia Type: General  Level of Consciousness: awake, oriented, sedated and patient cooperative  Airway & Oxygen Therapy: Patient Spontanous Breathing and Patient connected to face mask oxygen  Post-op Assessment: Report given to PACU RN and Post -op Vital signs reviewed and stable  Post vital signs: Reviewed and stable  Complications: No apparent anesthesia complications Last Vitals:  Vitals Value Taken Time  BP 108/78 11/08/24 09:44  Temp 36.3 C 11/08/24 09:44  Pulse 94 11/08/24 09:44  Resp 22 11/08/24 09:44  SpO2 96 % 11/08/24 09:44    Last Pain:  Vitals:   11/08/24 0711  PainSc: 0-No pain         Complications: No notable events documented.

## 2024-11-08 NOTE — Anesthesia Preprocedure Evaluation (Addendum)
"                                    Anesthesia Evaluation  Patient identified by MRN, date of birth, ID band Patient awake    Reviewed: Allergy & Precautions, NPO status , Patient's Chart, lab work & pertinent test results  Airway Mallampati: III  TM Distance: >3 FB Neck ROM: Full    Dental  (+) Teeth Intact, Dental Advisory Given   Pulmonary sleep apnea    breath sounds clear to auscultation       Cardiovascular negative cardio ROS  Rhythm:Regular Rate:Normal     Neuro/Psych negative neurological ROS  negative psych ROS   GI/Hepatic negative GI ROS, Neg liver ROS,,,  Endo/Other  diabetes, Type 2, Oral Hypoglycemic Agents    Renal/GU Renal disease     Musculoskeletal negative musculoskeletal ROS (+)    Abdominal   Peds  Hematology negative hematology ROS (+)   Anesthesia Other Findings   Reproductive/Obstetrics                              Anesthesia Physical Anesthesia Plan  ASA: 2  Anesthesia Plan: General   Post-op Pain Management: Minimal or no pain anticipated and Regional block*   Induction: Intravenous  PONV Risk Score and Plan: 2 and Ondansetron , Dexamethasone , Propofol  infusion and Midazolam   Airway Management Planned: LMA  Additional Equipment: None  Intra-op Plan:   Post-operative Plan: Extubation in OR  Informed Consent: I have reviewed the patients History and Physical, chart, labs and discussed the procedure including the risks, benefits and alternatives for the proposed anesthesia with the patient or authorized representative who has indicated his/her understanding and acceptance.       Plan Discussed with: CRNA  Anesthesia Plan Comments:          Anesthesia Quick Evaluation  "

## 2024-11-08 NOTE — Anesthesia Procedure Notes (Addendum)
 Anesthesia Regional Block: Popliteal block   Pre-Anesthetic Checklist: , timeout performed,  Correct Patient, Correct Site, Correct Laterality,  Correct Procedure, Correct Position, site marked,  Risks and benefits discussed,  Surgical consent,  Pre-op evaluation,  At surgeon's request and post-op pain management  Laterality: Left  Prep: chloraprep       Needles:  Injection technique: Single-shot  Needle Type: Echogenic Stimulator Needle     Needle Length: 9cm  Needle Gauge: 21     Additional Needles:   Procedures:,,,, ultrasound used (permanent image in chart),,    Narrative:  Start time: 11/08/2024 8:15 AM End time: 11/08/2024 8:20 AM Injection made incrementally with aspirations every 5 mL.  Performed by: Personally  Anesthesiologist: Tilford Franky BIRCH, MD  Additional Notes: Discussed risks and benefits of the nerve block in detail, including but not limited vascular injury, permanent nerve damage and infection.   Patient tolerated the procedure well. Local anesthetic introduced in an incremental fashion under minimal resistance after negative aspirations. No paresthesias were elicited. After completion of the procedure, no acute issues were identified and patient continued to be monitored by RN.

## 2024-11-08 NOTE — Progress Notes (Signed)
Assisted Dr. Smith Robert with left, adductor canal, popliteal, ultrasound guided block. Side rails up, monitors on throughout procedure. See vital signs in flow sheet. Tolerated Procedure well.

## 2024-11-08 NOTE — Anesthesia Procedure Notes (Signed)
 Procedure Name: LMA Insertion Date/Time: 11/08/2024 8:43 AM  Performed by: Delayne Olam BIRCH, CRNAPre-anesthesia Checklist: Patient identified, Emergency Drugs available, Suction available and Patient being monitored Patient Re-evaluated:Patient Re-evaluated prior to induction Oxygen Delivery Method: Circle system utilized Preoxygenation: Pre-oxygenation with 100% oxygen Induction Type: IV induction Ventilation: Mask ventilation without difficulty LMA: LMA inserted LMA Size: 4.0 Number of attempts: 1 Airway Equipment and Method: Bite block Placement Confirmation: positive ETCO2 Tube secured with: Tape Dental Injury: Teeth and Oropharynx as per pre-operative assessment

## 2024-11-08 NOTE — Anesthesia Procedure Notes (Signed)
 Anesthesia Regional Block: Adductor canal block   Pre-Anesthetic Checklist: , timeout performed,  Correct Patient, Correct Site, Correct Laterality,  Correct Procedure, Correct Position, site marked,  Risks and benefits discussed,  Surgical consent,  Pre-op evaluation,  At surgeon's request and post-op pain management  Laterality: Left  Prep: chloraprep       Needles:  Injection technique: Single-shot  Needle Type: Echogenic Stimulator Needle     Needle Length: 9cm  Needle Gauge: 21     Additional Needles:   Procedures:,,,, ultrasound used (permanent image in chart),,    Narrative:  Start time: 11/08/2024 8:20 AM End time: 11/08/2024 8:25 AM Injection made incrementally with aspirations every 5 mL.  Performed by: Personally  Anesthesiologist: Tilford Franky BIRCH, MD  Additional Notes: Discussed risks and benefits of the nerve block in detail, including but not limited vascular injury, permanent nerve damage and infection.   Patient tolerated the procedure well. Local anesthetic introduced in an incremental fashion under minimal resistance after negative aspirations. No paresthesias were elicited. After completion of the procedure, no acute issues were identified and patient continued to be monitored by RN.

## 2024-11-09 ENCOUNTER — Encounter (HOSPITAL_BASED_OUTPATIENT_CLINIC_OR_DEPARTMENT_OTHER): Payer: Self-pay | Admitting: Orthopaedic Surgery
